# Patient Record
Sex: Male | Born: 1965 | Race: White | Hispanic: No | Marital: Married | Smoking: Former smoker
Health system: Southern US, Community
[De-identification: ages and names within clinical notes are randomized; demographics above are authoritative.]

## PROBLEM LIST (undated history)

## (undated) DIAGNOSIS — Z9289 Personal history of other medical treatment: Secondary | ICD-10-CM

## (undated) DIAGNOSIS — C801 Malignant (primary) neoplasm, unspecified: Secondary | ICD-10-CM

## (undated) DIAGNOSIS — B081 Molluscum contagiosum: Secondary | ICD-10-CM

## (undated) DIAGNOSIS — K602 Anal fissure, unspecified: Secondary | ICD-10-CM

## (undated) DIAGNOSIS — N486 Induration penis plastica: Secondary | ICD-10-CM

## (undated) DIAGNOSIS — Q676 Pectus excavatum: Secondary | ICD-10-CM

## (undated) DIAGNOSIS — Z85828 Personal history of other malignant neoplasm of skin: Secondary | ICD-10-CM

## (undated) DIAGNOSIS — Z9889 Other specified postprocedural states: Secondary | ICD-10-CM

## (undated) HISTORY — DX: Pectus excavatum: Q67.6

## (undated) HISTORY — PX: EYE SURGERY: SHX253

## (undated) HISTORY — DX: Molluscum contagiosum: B08.1

## (undated) HISTORY — DX: Personal history of other medical treatment: Z92.89

## (undated) HISTORY — DX: Personal history of other malignant neoplasm of skin: Z85.828

## (undated) HISTORY — DX: Anal fissure, unspecified: K60.2

## (undated) HISTORY — DX: Other specified postprocedural states: Z98.890

## (undated) HISTORY — PX: VASECTOMY: SHX75

---

## 2004-09-22 ENCOUNTER — Encounter: Admission: RE | Admit: 2004-09-22 | Discharge: 2004-09-22 | Payer: Self-pay | Admitting: Emergency Medicine

## 2009-05-21 HISTORY — PX: MELANOMA EXCISION: SHX5266

## 2010-02-27 ENCOUNTER — Ambulatory Visit: Payer: Self-pay | Admitting: Family Medicine

## 2010-02-27 DIAGNOSIS — K602 Anal fissure, unspecified: Secondary | ICD-10-CM

## 2010-02-27 DIAGNOSIS — Z85828 Personal history of other malignant neoplasm of skin: Secondary | ICD-10-CM

## 2010-02-27 HISTORY — DX: Anal fissure, unspecified: K60.2

## 2010-03-21 ENCOUNTER — Ambulatory Visit: Payer: Self-pay | Admitting: Family Medicine

## 2010-03-21 ENCOUNTER — Telehealth: Payer: Self-pay | Admitting: Family Medicine

## 2010-03-22 ENCOUNTER — Telehealth: Payer: Self-pay | Admitting: Family Medicine

## 2010-06-20 NOTE — Progress Notes (Signed)
Summary: Resend Rx  Phone Note Call from Patient Call back at Work Phone [phone]   Summary of Call: Patient called and said his rx was on back order, [facility] [place] [address] [facility]. Please call him and let it know where it was sent.  Initial call taken by: Harold Barban,  March 21, 2010 1:53 PM  Follow-up for Phone Call        Phone Call Completed, Rx Called In Follow-up by: Kern Reap CMA Duncan Dull),  March 21, 2010 2:54 PM

## 2010-06-20 NOTE — Progress Notes (Signed)
Summary: biaxin question  Phone Note Call from Patient Call back at [phone]   Summary of Call: because the biaxin  was changed to 1 pill instead of 2 pills for 10 days would you like for the patinet to take one tab for 10 days or longer?   Initial call taken by: Kern Reap CMA Duncan Dull),  March 22, 2010 11:42 AM  Follow-up for Phone Call        one tablet a day for 10 days Follow-up by: Roderick Pee MD,  March 22, 2010 11:48 AM  Additional Follow-up for Phone Call Additional follow up Details #1::        Phone Call Completed Additional Follow-up by: Kern Reap CMA Duncan Dull),  March 22, 2010 12:02 PM

## 2010-06-20 NOTE — Assessment & Plan Note (Signed)
Summary: URI? // RS   Vital Signs:  Patient profile:   45 year old male Height:      74 inches Weight:      200 pounds BMI:     25.77 Temp:     98.7 degrees F BP sitting:   130 / 90  (left arm) Cuff size:   regular  Vitals Entered By: Kern Reap CMA Duncan Dull) (March 21, 2010 12:35 PM) CC: chest/head congestion, right chest pain   CC:  chest/head congestion and right chest pain.  History of Present Illness: Juan Bender is a 45 year old, married male, nonsmoker, who comes in today with a persistent cough.  We saw him about a month ago with a cough and some wheezing.  Chest x-ray was normal.  We placed him on a short course of prednisone.  That didn't seem to help all that much.  He now comes in stating the cough is unchanged.  He has no fever no sputum production.  He does have a lot of head congestion and postnasal drip  Allergies (verified): No Known Drug Allergies  Past History:  Past medical, surgical, family and social histories (including risk factors) reviewed for relevance to current acute and chronic problems.  Past Medical History: Reviewed history from 02/27/2010 and no changes required. Skin cancer, hx of...melanomas x 3 2011....[facility].  Past Surgical History: Reviewed history from 02/27/2010 and no changes required. melanoma x 3........ two on the back, one on the left side of his face treated [facility]ology  Family History: Reviewed history from 02/27/2010 and no changes required. father 47, has beginnings of memory problems mother alive and well no brothers one sister in good health  Social History: Reviewed history from 02/27/2010 and no changes required. Occupation: Married Never Smoked Alcohol use-no Drug use-no Regular exercise-yes  Review of Systems      See HPI  Physical Exam  General:  Well-developed,well-nourished,in no acute distress; alert,appropriate and cooperative throughout examination Head:  Normocephalic and atraumatic without  obvious abnormalities. No apparent alopecia or balding. Eyes:  No corneal or conjunctival inflammation noted. EOMI. Perrla. Funduscopic exam benign, without hemorrhages, exudates or papilledema. Vision grossly normal. Ears:  External ear exam shows no significant lesions or deformities.  Otoscopic examination reveals clear canals, tympanic membranes are intact bilaterally without bulging, retraction, inflammation or discharge. Hearing is grossly normal bilaterally. Nose:  External nasal examination shows no deformity or inflammation. Nasal mucosa are pink and moist without lesions or exudates. Mouth:  Oral mucosa and oropharynx without lesions or exudates.  Teeth in good repair. Neck:  No deformities, masses, or tenderness noted. Lungs:  Normal respiratory effort, chest expands symmetrically. Lungs are clear to auscultation, no crackles or wheezes.   Problems:  Medical Problems Added: 1)  Dx of Cough  (ICD-786.2)  Impression & Recommendations:  Problem # 1:  COUGH (ICD-786.2) Assessment Unchanged  Complete Medication List: 1)  Biaxin 500 Mg Tabs (Clarithromycin) .... Take 1 tablet by mouth two times a day  Patient Instructions: 1)  begin Biaxin 500 mg twice daily, drink lots of water, return p.r.n. Prescriptions: BIAXIN 500 MG TABS (CLARITHROMYCIN) Take 1 tablet by mouth two times a day  #20 x 1   Entered and Authorized by:   Roderick Pee MD   Signed by:   Roderick Pee MD on 03/21/2010   Method used:   [facility]  [address]  [address]       [place], [place]  [address]       Ph: [phone] or [phone]       Fax: [phone]   RxID:   [id number]    Orders Added: 1)  Est. Patient Level III [42706]

## 2010-06-20 NOTE — Assessment & Plan Note (Signed)
Summary: URI/BLOOD IN STOOL/ACU VISIT ONLY/C[facility]   Vital Signs:  Patient profile:   45 year old male Weight:      194 pounds Temp:     99 degrees F oral Resp:     16 per minute BP sitting:   146 / 96  Vitals Entered By: Lynann Beaver CMA (February 27, 2010 9:09 AM) CC: URI/cough Is Patient Diabetic? No Pain Assessment Patient in pain? no        CC:  URI/cough.  History of Present Illness: Juan Bender is a 45 year old, married male, nonsmoker, who comes in today as a new patient for evaluation of two problems.  He states on September the 21st he developed fever and cough.  He went to his [facility] physician on September the 28th.  He was told he had a viral infection however, was given a Z-Pak.  He took it and it did seem to make any difference.  He still has a cough.  No fever.  He's also had some right-sided chest pain.  The day prior to this episode.  He took a 5 hour airplane ride to [facility].  Pulmonary review of systems negative.  No hemoptysis.he also complains of some sharp, right-sided chest pain  A second problem is by rectal bleeding that he said since October.  The second.  It's painful.  No history of any GI problems.  Father has a history of colon polyps.  This past year.  He said 3 melanomas removed, one from his left side of his face two from his back by [facility]  Current Medications (verified): 1)  None  Allergies (verified): No Known Drug Allergies  Review of Systems      See HPI  Physical Exam  General:  Well-developed,well-nourished,in no acute distress; alert,appropriate and cooperative throughout examination Head:  Normocephalic and atraumatic without obvious abnormalities. No apparent alopecia or balding. Eyes:  No corneal or conjunctival inflammation noted. EOMI. Perrla. Funduscopic exam benign, without hemorrhages, exudates or papilledema. Vision grossly normal. Ears:  External ear exam shows no significant lesions or deformities.  Otoscopic  examination reveals clear canals, tympanic membranes are intact bilaterally without bulging, retraction, inflammation or discharge. Hearing is grossly normal bilaterally. Nose:  External nasal examination shows no deformity or inflammation. Nasal mucosa are pink and moist without lesions or exudates. Mouth:  Oral mucosa and oropharynx without lesions or exudates.  Teeth in good repair. Neck:  No deformities, masses, or tenderness noted. Chest Wall:  pectus deformity Breasts:  No masses or gynecomastia noted Lungs:  Normal respiratory effort, chest expands symmetrically. Lungs are clear to auscultation, no crackles or wheezes. Heart:  Normal rate and regular rhythm. S1 and S2 normal without gallop, murmur, click, rub or other extra sounds. Abdomen:  Bowel sounds positive,abdomen soft and non-tender without masses, organomegaly or hernias noted. Rectal:  no obvious external hemorrhoids.  There is a rectal fissure at the 6 o'clock position.  Digital rectal exam negative.  Stool guaiac-negative   Problems:  Medical Problems Added: 1)  Dx of Rectal Fissure  (ICD-565.0) 2)  Dx of Cough  (ICD-786.2)  Impression & Recommendations:  Problem # 1:  COUGH (ICD-786.2) Assessment New  Orders: T-2 View CXR (71020TC)  Problem # 2:  RECTAL FISSURE (ICD-565.0) Assessment: New  Complete Medication List: 1)  Prednisone 20 Mg Tabs (Prednisone) .... Uad  Patient Instructions: 1)  go to the main office now for a chest x-ray. 2)  When we get the x-ray report back.  I will call you . 3)  Begin prednisone two tabs x 3 days, one x 3 days, a half x 3 days, then a half a tablet Monday, Wednesday, Friday, for a two week taper. 4)  Take milk of Magnesia or the stool softener of your preference and soak for 10 minutes atbedtime   Apply medicated cream.  This should resolve in 10 days to two weeks.  If the bleeding does not resolve call me.  Prescriptions: PREDNISONE 20 MG TABS (PREDNISONE) UAD  #30 x 1    Entered and Authorized by:   Roderick Pee MD   Signed by:   Roderick Pee MD on 02/27/2010   Method used:   Print then Give to Patient   RxID:   [id number]

## 2011-12-04 ENCOUNTER — Other Ambulatory Visit: Payer: Self-pay | Admitting: Urology

## 2011-12-18 ENCOUNTER — Encounter (HOSPITAL_BASED_OUTPATIENT_CLINIC_OR_DEPARTMENT_OTHER): Payer: Self-pay | Admitting: *Deleted

## 2011-12-18 NOTE — Progress Notes (Signed)
Pt instructed npo p mn 8/1.  To [facility] 8/2 @ 0730.  Needs hgb, ekg on arrival

## 2011-12-20 NOTE — H&P (Signed)
History of Present Illness          46 yo married male with a hx of Peyronie's disease.  Hx of post traumatic sexual activity 4 yrs ago. Pt felt a "pop" with intercourse, and now has penile curvature dorsal, 20 degrees. It is non-painful, and he is still able to penetrate with intercourse. He has significant "pulling" sensation with erection. He voids normally.  Patient was offerred to participate in the [employer] drug study, but patient declined.   Past Medical History Problems  1. History of  Destruction Of Malignant Lesion 2. History of  Skin Cancer V10.83  Surgical History Problems  1. History of  Corneal LASIK Bilateral 2. History of  Surgery Of Male Genitalia Vasectomy V25.2  Current Meds 1. No Reported Medications  Allergies Medication  1. No Known Drug Allergies  Family History Problems  1. Family history of  Family Health Status - Father's Age 60. Family history of  Family Health Status - Mother's Age 44. Family history of  Family Health Status Number Of Children 1 son, 2 daughters  Social History Problems  1. Alcohol Use 5-7 per wk 2. Caffeine Use 4 per day 3. Former Smoker V15.82 1 cigar per wk x 3-40yrs, quit 5+yrs ago 4. Marital History - Currently Married 5. Occupation: Management  Review of Systems Genitourinary, constitutional, skin, eye, otolaryngeal, hematologic/lymphatic, cardiovascular, pulmonary, endocrine, musculoskeletal, gastrointestinal, neurological and psychiatric system(s) were reviewed and pertinent findings if present are noted.  Genitourinary: erectile dysfunction, penile pain and penile curvature.    Vitals Vital Signs Blood Pressure: 106 / 59 Heart Rate: 55  Physical Exam Constitutional: Well nourished and well developed . No acute distress.  Abdomen: The abdomen is soft and nontender. No masses are palpated. No CVA tenderness. No hernias are palpable. No hepatosplenomegaly noted.  Genitourinary: Examination of the penis  demonstrates no swelling, no tenderness, no discharge, no masses, no adherence of the prepuce, no phimosis, no paraphimosis, no lesions and a normal meatus. The penis is circumcised. The scrotum is without lesions. The right epididymis is palpably normal and non-tender. The left epididymis is palpably normal and non-tender. The right testis is non-tender and without masses. The left testis is non-tender and without masses. Peyronie's plaque, bilateral distal, dorsal corpora cavernosa. Circumcised. Non-tender.  Lymphatics: The femoral and inguinal nodes are not enlarged or tender.  Skin: Normal skin turgor, no visible rash and no visible skin lesions.  Neuro/Psych:. Mood and affect are appropriate.    Assessment Assessed  1. Peyronie's Disease 607.85   Peyronie's disease, with functional loss of 1cm length of penis. He is able to get erection, although with 30 degrees of dorsal curveature, and erectile ability in both the proximal corpora and distal corproa and glans. He is still able to gain penetration.    Surgery would be outpatient and would take 6 weeks to heal. 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 He may need more surgery if the 16 dot plication doesn't work, including slits in the scar, and intra op molding. He will not be able to have sex for 6 weeks.   Plan: We discussed his history and since he still is able to have good erections but is having difficulty engaging in intercourse. I therefore have discussed with the patient the procedure of 16 dot plication in detail. We discussed the alternatives. I went over the incision used, the risks and comp occasions as well as anticipated  postoperative course. He is aware of the foreshortening that can occur with this procedure and has elected to proceed.

## 2011-12-21 ENCOUNTER — Encounter (HOSPITAL_BASED_OUTPATIENT_CLINIC_OR_DEPARTMENT_OTHER): Payer: Self-pay | Admitting: *Deleted

## 2011-12-21 ENCOUNTER — Ambulatory Visit (HOSPITAL_BASED_OUTPATIENT_CLINIC_OR_DEPARTMENT_OTHER)
Admission: RE | Admit: 2011-12-21 | Discharge: 2011-12-21 | Disposition: A | Discharge status: Home / Self Care | Payer: BC Managed Care – PPO | Source: Ambulatory Visit | Attending: Urology | Admitting: Urology

## 2011-12-21 ENCOUNTER — Ambulatory Visit (HOSPITAL_BASED_OUTPATIENT_CLINIC_OR_DEPARTMENT_OTHER): Payer: BC Managed Care – PPO | Admitting: Anesthesiology

## 2011-12-21 ENCOUNTER — Encounter (HOSPITAL_BASED_OUTPATIENT_CLINIC_OR_DEPARTMENT_OTHER): Payer: Self-pay | Admitting: Anesthesiology

## 2011-12-21 ENCOUNTER — Encounter (HOSPITAL_BASED_OUTPATIENT_CLINIC_OR_DEPARTMENT_OTHER)
Admission: RE | Disposition: A | Discharge status: Home / Self Care | Payer: Self-pay | Source: Ambulatory Visit | Attending: Urology

## 2011-12-21 DIAGNOSIS — Z01810 Encounter for preprocedural cardiovascular examination: Secondary | ICD-10-CM | POA: Insufficient documentation

## 2011-12-21 DIAGNOSIS — N486 Induration penis plastica: Secondary | ICD-10-CM | POA: Diagnosis present

## 2011-12-21 DIAGNOSIS — Z85828 Personal history of other malignant neoplasm of skin: Secondary | ICD-10-CM | POA: Insufficient documentation

## 2011-12-21 DIAGNOSIS — Q5569 Other congenital malformation of penis: Secondary | ICD-10-CM | POA: Insufficient documentation

## 2011-12-21 HISTORY — DX: Malignant (primary) neoplasm, unspecified: C80.1

## 2011-12-21 HISTORY — PX: NESBIT PROCEDURE: SHX2087

## 2011-12-21 HISTORY — DX: Induration penis plastica: N48.6

## 2011-12-21 SURGERY — NESBIT PROCEDURE
Anesthesia: General | Site: Penis | Wound class: Clean

## 2011-12-21 MED ORDER — CEFAZOLIN SODIUM-DEXTROSE 2-3 GM-% IV SOLR: 2.0000 g | INTRAVENOUS | Status: DC

## 2011-12-21 MED ORDER — CEFAZOLIN SODIUM 1-5 GM-% IV SOLN: 1.0000 g | INTRAVENOUS | Status: DC

## 2011-12-21 MED ORDER — LIDOCAINE HCL (CARDIAC) 20 MG/ML IV SOLN
INTRAVENOUS | Status: DC | PRN
  Administered 2011-12-21: 100 mg via INTRAVENOUS

## 2011-12-21 MED ORDER — MIDAZOLAM HCL 5 MG/5ML IJ SOLN
INTRAMUSCULAR | Status: DC | PRN
  Administered 2011-12-21: 2 mg via INTRAVENOUS

## 2011-12-21 MED ORDER — KETOROLAC TROMETHAMINE 30 MG/ML IJ SOLN
INTRAMUSCULAR | Status: DC | PRN
  Administered 2011-12-21: 30 mg via INTRAVENOUS

## 2011-12-21 MED ORDER — HYDROCODONE-ACETAMINOPHEN 10-325 MG PO TABS: 1.0000 | ORAL_TABLET | ORAL | Status: AC | PRN

## 2011-12-21 MED ORDER — CEFAZOLIN SODIUM 1-5 GM-% IV SOLN
INTRAVENOUS | Status: DC | PRN
  Administered 2011-12-21: 2 g via INTRAVENOUS

## 2011-12-21 MED ORDER — LACTATED RINGERS IV SOLN
INTRAVENOUS | Status: DC
  Administered 2011-12-21: 08:00:00 via INTRAVENOUS

## 2011-12-21 MED ORDER — PROPOFOL 10 MG/ML IV EMUL
INTRAVENOUS | Status: DC | PRN
  Administered 2011-12-21: 300 mg via INTRAVENOUS

## 2011-12-21 MED ORDER — MEPERIDINE HCL 25 MG/ML IJ SOLN: 6.2500 mg | INTRAMUSCULAR | Status: DC | PRN

## 2011-12-21 MED ORDER — DEXAMETHASONE SODIUM PHOSPHATE 4 MG/ML IJ SOLN
INTRAMUSCULAR | Status: DC | PRN
  Administered 2011-12-21: 10 mg via INTRAVENOUS

## 2011-12-21 MED ORDER — PAPAVERINE HCL 30 MG/ML IJ SOLN
INTRAMUSCULAR | Status: DC | PRN
  Administered 2011-12-21: 180 mg via INTRAVENOUS

## 2011-12-21 MED ORDER — LACTATED RINGERS IV SOLN: INTRAVENOUS | Status: DC

## 2011-12-21 MED ORDER — PROMETHAZINE HCL 25 MG/ML IJ SOLN: 6.2500 mg | INTRAMUSCULAR | Status: DC | PRN

## 2011-12-21 MED ORDER — FENTANYL CITRATE 0.05 MG/ML IJ SOLN: 25.0000 ug | INTRAMUSCULAR | Status: DC | PRN

## 2011-12-21 MED ORDER — FENTANYL CITRATE 0.05 MG/ML IJ SOLN
INTRAMUSCULAR | Status: DC | PRN
  Administered 2011-12-21 (×6): 25 ug via INTRAVENOUS
  Administered 2011-12-21: 100 ug via INTRAVENOUS
  Administered 2011-12-21 (×2): 25 ug via INTRAVENOUS

## 2011-12-21 MED ORDER — ONDANSETRON HCL 4 MG/2ML IJ SOLN
INTRAMUSCULAR | Status: DC | PRN
  Administered 2011-12-21: 4 mg via INTRAVENOUS

## 2011-12-21 MED ORDER — PHENYLEPHRINE HCL 10 MG/ML IJ SOLN
INTRAMUSCULAR | Status: DC | PRN
  Administered 2011-12-21: 100 ug

## 2011-12-21 MED ORDER — LACTATED RINGERS IV SOLN
INTRAVENOUS | Status: DC | PRN
  Administered 2011-12-21: 08:00:00 via INTRAVENOUS

## 2011-12-21 SURGICAL SUPPLY — 68 items
BAND RUBBER #16 2-1/2X1/16 STR (MISCELLANEOUS) IMPLANT
BANDAGE CO FLEX L/F 1IN X 5YD (GAUZE/BANDAGES/DRESSINGS) IMPLANT
BANDAGE CO FLEX L/F 2IN X 5YD (GAUZE/BANDAGES/DRESSINGS) IMPLANT
BANDAGE COBAN STERILE 2 (GAUZE/BANDAGES/DRESSINGS) ×1 IMPLANT
BANDAGE GAUZE ELAST BULKY 4 IN (GAUZE/BANDAGES/DRESSINGS) ×2 IMPLANT
BLADE SURG 15 STRL LF DISP TIS (BLADE) ×1 IMPLANT
BLADE SURG 15 STRL SS (BLADE) ×2
BLADE SURG ROTATE 9660 (MISCELLANEOUS) IMPLANT
BNDG COHESIVE 3X5 TAN STRL LF (GAUZE/BANDAGES/DRESSINGS) ×2 IMPLANT
CANISTER SUCTION 1200CC (MISCELLANEOUS) IMPLANT
CANISTER SUCTION 2500CC (MISCELLANEOUS) ×1 IMPLANT
CATH FOLEY 2WAY SLVR  5CC 16FR (CATHETERS) ×1
CATH FOLEY 2WAY SLVR 5CC 16FR (CATHETERS) ×1 IMPLANT
CATH ROBINSON RED A/P 8FR (CATHETERS) ×2 IMPLANT
CLOTH BEACON ORANGE TIMEOUT ST (SAFETY) ×2 IMPLANT
COVER MAYO STAND STRL (DRAPES) IMPLANT
COVER TABLE BACK 60X90 (DRAPES) IMPLANT
DRAIN PENROSE 18X1/4 LTX STRL (WOUND CARE) IMPLANT
DRAPE PED LAPAROTOMY (DRAPES) ×2 IMPLANT
ELECT NDL TIP 2.8 STRL (NEEDLE) IMPLANT
ELECT NEEDLE TIP 2.8 STRL (NEEDLE) IMPLANT
ELECT REM PT RETURN 9FT ADLT (ELECTROSURGICAL) ×2
ELECTRODE REM PT RTRN 9FT ADLT (ELECTROSURGICAL) ×1 IMPLANT
GAUZE SPONGE 4X4 12PLY STRL LF (GAUZE/BANDAGES/DRESSINGS) IMPLANT
GLOVE BIO SURGEON STRL SZ 6.5 (GLOVE) ×1 IMPLANT
GLOVE BIO SURGEON STRL SZ8 (GLOVE) ×2 IMPLANT
GLOVE ECLIPSE 6.0 STRL STRAW (GLOVE) ×1 IMPLANT
GOWN STRL NON-REIN LRG LVL3 (GOWN DISPOSABLE) ×1 IMPLANT
GOWN STRL REIN XL XLG (GOWN DISPOSABLE) ×1 IMPLANT
GOWN W/COTTON TOWEL STD LRG (GOWNS) ×1 IMPLANT
GOWN XL W/COTTON TOWEL STD (GOWNS) ×2 IMPLANT
IV NS IRRIG 3000ML ARTHROMATIC (IV SOLUTION) IMPLANT
IV TUR Y TUBING LATEX FREE (IV SOLUTION) IMPLANT
LOOP VESSEL MAXI BLUE (MISCELLANEOUS) IMPLANT
NDL HYPO 25X1 1.5 SAFETY (NEEDLE) ×3 IMPLANT
NDL HYPO 30X.5 LL (NEEDLE) IMPLANT
NEEDLE HYPO 25X1 1.5 SAFETY (NEEDLE) ×6 IMPLANT
NEEDLE HYPO 30X.5 LL (NEEDLE) ×4 IMPLANT
NS IRRIG 500ML POUR BTL (IV SOLUTION) ×2 IMPLANT
PACK BASIN DAY SURGERY FS (CUSTOM PROCEDURE TRAY) ×2 IMPLANT
PENCIL BUTTON HOLSTER BLD 10FT (ELECTRODE) ×2 IMPLANT
PLUG CATH AND CAP STER (CATHETERS) ×2 IMPLANT
SET COLLECT BLD 21X3/4 12 (NEEDLE) ×2 IMPLANT
SPONGE GAUZE 4X4 12PLY (GAUZE/BANDAGES/DRESSINGS) ×1 IMPLANT
SPONGE LAP 4X18 X RAY DECT (DISPOSABLE) ×1 IMPLANT
SUCTION FRAZIER TIP 10 FR DISP (SUCTIONS) IMPLANT
SUPPORT SCROTAL LG STRP (MISCELLANEOUS) IMPLANT
SUT CHROMIC 3 0 SH 27 (SUTURE) IMPLANT
SUT CHROMIC 4 0 RB 1X27 (SUTURE) ×3 IMPLANT
SUT CHROMIC 5 0 RB 1 27 (SUTURE) IMPLANT
SUT ETHIBOND 2 0 SH (SUTURE) ×2
SUT ETHIBOND 2 0 SH 36X2 (SUTURE) ×3 IMPLANT
SUT ETHIBOND 2 OS 4 DA (SUTURE) IMPLANT
SUT ETHIBOND 3-0 V-5 (SUTURE) IMPLANT
SUT PDS AB 4-0 RB1 27 (SUTURE) IMPLANT
SUT SILK 4 0 (SUTURE)
SUT SILK 4-0 18XBRD TIE 12 (SUTURE) IMPLANT
SUT VIC AB 5-0 RB1 27 (SUTURE) IMPLANT
SYR 3ML 18GX1 1/2 (SYRINGE) ×1 IMPLANT
SYR 5ML LL (SYRINGE) ×4 IMPLANT
SYR BULB IRRIGATION 50ML (SYRINGE) IMPLANT
SYR CONTROL 10ML LL (SYRINGE) ×2 IMPLANT
SYRINGE 10CC LL (SYRINGE) ×4 IMPLANT
TOWEL OR 17X24 6PK STRL BLUE (TOWEL DISPOSABLE) ×4 IMPLANT
TUBE CONNECTING 12X1/4 (SUCTIONS) IMPLANT
WATER STERILE IRR 3000ML UROMA (IV SOLUTION) IMPLANT
WATER STERILE IRR 500ML POUR (IV SOLUTION) ×2 IMPLANT
YANKAUER SUCT BULB TIP NO VENT (SUCTIONS) IMPLANT

## 2011-12-21 NOTE — Transfer of Care (Signed)
Immediate Anesthesia Transfer of Care Note  Patient: Juan Bender  Procedure(s) Performed: Procedure(s) (LRB): NESBIT PROCEDURE (N/A)  Patient Location: PACU  Anesthesia Type: General  Level of Consciousness: awake, sedated, patient cooperative and responds to stimulation  Airway & Oxygen Therapy: Patient Spontanous Breathing and Patient connected to face mask oxygen  Post-op Assessment: Report given to PACU RN, Post -op Vital signs reviewed and stable and Patient moving all extremities  Post vital signs: Reviewed and stable  Complications: No apparent anesthesia complications

## 2011-12-21 NOTE — Op Note (Signed)
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 This occurred after penile trauma during intercourse for years ago. He now is able to achieve an erection but has a significant pulling sensation with erection and difficulty with intercourse. He has elected to proceed with the surgery as noted above.  ANESTHESIA:  General  EBL:  Minimal  Description of procedure: After informed consent the patient was taken to the major or, placed on the table and administered general anesthesia in the supine position. His genitalia was sterilely prepped and draped and an official timeout was then performed.  I initially injected 30 mg of papaverine into the corpus cavernosum using a 30-gauge needle. This resulted in a good erection and I was able to determine the point of maximum curvature. This was marked both dorsally and ventrally with a surgical marker. A Foley catheter was then inserted in the bladder and the bladder was drained.  An incision was made in the midline over the corpus spongiosum and and I then dissected down to the corpus spongiosum throughout the length of the incision. I then dissected laterally on to the corpus cavernosum on each side. Retractors were used to retract the skin distally and proximally to expose the corpus cavernosum throughout its length on each side of the area of greatest curvature. I injected a second 60 cc of papaverine and then marked, with a surgical marker, the 16 equally placed knots approximately 3 mm lateral to the corpus spongiosum. This traversed the area of greatest curvature equally both proximally and distally. I then used 2-0 braided polyester suture and placed this in the previously located  marks. This was placed through the first set of marks on the left side distally and then the left side proximally. The same thing was performed on the right side. This allowed the 4 separate sutures to be tied with a surgical knot and a hemostat applied to the not so that I could then make adjustments both tightening and loosening each of the sutures until I achieved a straight penis without any curvature in the dorsal/ventral and lateral planes. I then tied each of these sutures down.   I then injected 500 mcg of Neo-Synephrine which resulted in detumescence of the penis. Bleeding points were cauterized and I then closed the incision first by closing the deep penile tissue in the midline with a running, locking 4-0 chromic suture. A second 4-0 chromic was used to close the penile skin in the midline in a running fashion as well. I then applied Neosporin to the incision, placed a single sterile 4 x 4 gauze over the incision and then gently wrapped the penis with 4 x 4 gauze that had been unfolded. Multiple pieces of gauze were used in order to allow some expansion if necessary but also apply some mild pressure and a Kopan was used to secure this to the penis. The patient was awakened and taken to the recovery room in stable and satisfactory condition. He tolerated the procedure well with no intraoperative complications.  PLAN OF CARE: Discharge to home after PACU  PATIENT DISPOSITION:  PACU - hemodynamically stable.

## 2011-12-21 NOTE — OR Nursing (Signed)
neosynephrine 165mcg/ ml used to decrease erection during procedure.

## 2011-12-21 NOTE — Anesthesia Preprocedure Evaluation (Signed)
Anesthesia Evaluation  Patient identified by MRN, date of birth, ID band Patient awake    Reviewed: Allergy & Precautions, H&P , NPO status , Patient's Chart, lab work & pertinent test results  Airway Mallampati: II TM Distance: >3 FB Neck ROM: full    Dental No notable dental hx.    Pulmonary neg pulmonary ROS,  breath sounds clear to auscultation  Pulmonary exam normal       Cardiovascular Exercise Tolerance: Good negative cardio ROS  Rhythm:regular Rate:Normal     Neuro/Psych negative neurological ROS  negative psych ROS   GI/Hepatic negative GI ROS, Neg liver ROS,   Endo/Other  negative endocrine ROS  Renal/GU negative Renal ROS  negative genitourinary   Musculoskeletal   Abdominal   Peds  Hematology negative hematology ROS (+)   Anesthesia Other Findings   Reproductive/Obstetrics negative OB ROS                           Anesthesia Physical Anesthesia Plan  ASA: I  Anesthesia Plan: General LMA   Post-op Pain Management:    Induction:   Airway Management Planned:   Additional Equipment:   Intra-op Plan:   Post-operative Plan:   Informed Consent: I have reviewed the patients History and Physical, chart, labs and discussed the procedure including the risks, benefits and alternatives for the proposed anesthesia with the patient or authorized representative who has indicated his/her understanding and acceptance.   Dental Advisory Given  Plan Discussed with: CRNA  Anesthesia Plan Comments:         Anesthesia Quick Evaluation  

## 2011-12-21 NOTE — Anesthesia Procedure Notes (Addendum)
Procedure Name: LMA Insertion Date/Time: 12/21/2011 9:03 AM Performed by: Jessica Priest Pre-anesthesia Checklist: Patient identified, Emergency Drugs available, Suction available and Patient being monitored Patient Re-evaluated:Patient Re-evaluated prior to inductionOxygen Delivery Method: Circle System Utilized Preoxygenation: Pre-oxygenation with 100% oxygen Intubation Type: IV induction Ventilation: Mask ventilation without difficulty LMA: LMA inserted LMA Size: 5.0 Number of attempts: 1 Airway Equipment and Method: bite block Placement Confirmation: positive ETCO2 Tube secured with: Tape Dental Injury: Teeth and Oropharynx as per pre-operative assessment

## 2011-12-21 NOTE — Anesthesia Postprocedure Evaluation (Signed)
  Anesthesia Post-op Note  Patient: Juan Bender  Procedure(s) Performed: Procedure(s) (LRB): NESBIT PROCEDURE (N/A)  Patient Location: PACU  Anesthesia Type: General  Level of Consciousness: awake and alert   Airway and Oxygen Therapy: Patient Spontanous Breathing  Post-op Pain: mild  Post-op Assessment: Post-op Vital signs reviewed, Patient's Cardiovascular Status Stable, Respiratory Function Stable, Patent Airway and No signs of Nausea or vomiting  Post-op Vital Signs: stable  Complications: No apparent anesthesia complications

## 2011-12-21 NOTE — Interval H&P Note (Signed)
History and Physical Interval Note:  12/21/2011 8:19 AM  Juan Bender  has presented today for surgery, with the diagnosis of Peyronie's Disease  The various methods of treatment have been discussed with the patient and family. After consideration of risks, benefits and other options for treatment, the patient has consented to  Procedure(s) (LRB): NESBIT PROCEDURE (N/A) as a surgical intervention .  The patient's history has been reviewed, patient examined, no change in status, stable for surgery.  I have reviewed the patient's chart and labs.  Questions were answered to the patient's satisfaction.     Garnett Farm

## 2011-12-24 ENCOUNTER — Encounter (HOSPITAL_BASED_OUTPATIENT_CLINIC_OR_DEPARTMENT_OTHER): Payer: Self-pay | Admitting: Urology

## 2011-12-24 LAB — POCT HEMOGLOBIN-HEMACUE: Hemoglobin: 15.7 g/dL (ref 13.0–17.0)

## 2012-07-17 DIAGNOSIS — Z86006 Personal history of melanoma in-situ: Secondary | ICD-10-CM | POA: Insufficient documentation

## 2013-05-29 ENCOUNTER — Encounter: Payer: BC Managed Care – PPO | Admitting: Family Medicine

## 2013-05-29 DIAGNOSIS — Z0289 Encounter for other administrative examinations: Secondary | ICD-10-CM

## 2013-05-29 NOTE — Progress Notes (Signed)
Error   This encounter was created in error - please disregard. 

## 2013-09-10 ENCOUNTER — Ambulatory Visit (INDEPENDENT_AMBULATORY_CARE_PROVIDER_SITE_OTHER)
Admission: RE | Admit: 2013-09-10 | Discharge: 2013-09-10 | Disposition: A | Discharge status: Home / Self Care | Payer: BC Managed Care – PPO | Source: Ambulatory Visit | Attending: Family Medicine | Admitting: Family Medicine

## 2013-09-10 ENCOUNTER — Ambulatory Visit (INDEPENDENT_AMBULATORY_CARE_PROVIDER_SITE_OTHER): Payer: BC Managed Care – PPO | Admitting: Family Medicine

## 2013-09-10 ENCOUNTER — Encounter: Payer: Self-pay | Admitting: Family Medicine

## 2013-09-10 VITALS — BP 120/80 | Temp 97.8°F | Wt 204.0 lb

## 2013-09-10 DIAGNOSIS — Z85828 Personal history of other malignant neoplasm of skin: Secondary | ICD-10-CM

## 2013-09-10 DIAGNOSIS — L03032 Cellulitis of left toe: Secondary | ICD-10-CM

## 2013-09-10 DIAGNOSIS — R05 Cough: Secondary | ICD-10-CM

## 2013-09-10 DIAGNOSIS — G4730 Sleep apnea, unspecified: Secondary | ICD-10-CM

## 2013-09-10 DIAGNOSIS — R059 Cough, unspecified: Secondary | ICD-10-CM

## 2013-09-10 DIAGNOSIS — L03039 Cellulitis of unspecified toe: Secondary | ICD-10-CM

## 2013-09-10 DIAGNOSIS — R0683 Snoring: Secondary | ICD-10-CM | POA: Insufficient documentation

## 2013-09-10 MED ORDER — CEPHALEXIN 500 MG PO CAPS: ORAL_CAPSULE | ORAL | Status: DC

## 2013-09-10 NOTE — Patient Instructions (Signed)
For the infection in your toe soak nightly, antibiotic ointment and a Band-Aid, Keflex..... 2 tabs twice a day  Go to the main office now for a chest x-ray  If you decide you would like to pursue the sleep apnea evaluation,,,,,,,,,,,dr keith clance  Have your wife so a tennis ball in the T-shirt

## 2013-09-10 NOTE — Progress Notes (Signed)
Pre visit review using our clinic review tool, if applicable. No additional management support is needed unless otherwise documented below in the visit note. 

## 2013-09-10 NOTE — Progress Notes (Signed)
   Subjective:    Patient ID: Juan Bender, male    DOB: [birth year], 48 y.o.   MRN: [id number]  HPI Juan Bender is a 48 year old married male nonsmoker who comes in today for evaluation of a couple problems  He's noticed for the past 2 weeks she's had some pain and swelling l medial portion left great toe. No history of trauma.  He's had sleep apnea for a long time and has not been bothering until about 6 months ago when he started developed a cough. His wife wants him to go for a sleep apnea evaluation however he says even if you went for evaluation he would not wear the device.  2011 he had 2 melanomas excised from his back  No hemoptysis  weight loss etc.   Review of Systems Review of systems otherwise negative    Objective:   Physical Exam Well-developed well-nourished male no acute distress vital signs stable he is afebrile HEENT negative neck was supple no adenopathy lungs are clear to 2 inch scars on his back from previous melanoma excision  Examination of foot shows medial portion left great toe with a slight infection       Assessment & Plan:  Infection left great toenail,,,,,,,,, hot soaks,,,,,,,,,, antibiotics return when necessary  History of sleep apnea......... patient declines further evaluation  Cough,,,,,,,,,,, chest x-ray,,

## 2013-12-30 ENCOUNTER — Encounter: Payer: Self-pay | Admitting: Family Medicine

## 2013-12-30 ENCOUNTER — Ambulatory Visit (INDEPENDENT_AMBULATORY_CARE_PROVIDER_SITE_OTHER): Payer: BC Managed Care – PPO | Admitting: Family Medicine

## 2013-12-30 VITALS — BP 130/90 | Temp 98.0°F | Ht 74.0 in | Wt 202.0 lb

## 2013-12-30 DIAGNOSIS — B081 Molluscum contagiosum: Secondary | ICD-10-CM

## 2013-12-30 HISTORY — DX: Molluscum contagiosum: B08.1

## 2013-12-30 NOTE — Progress Notes (Signed)
Pre visit review using our clinic review tool, if applicable. No additional management support is needed unless otherwise documented below in the visit note. 

## 2013-12-30 NOTE — Patient Instructions (Signed)
The lesions were of all spontaneously if however you would like to see if they would go away quicker I will give you a sterile needle to drain and

## 2013-12-30 NOTE — Progress Notes (Signed)
   Subjective:    Patient ID: Juan Bender, male    DOB: 30-May-1965, 48 y.o.   MRN: [id number]  HPI Juan Bender is a 48 year old male who comes in today for evaluation rash in his groin  He notices a while back and it didn't seem to go away. His nonperiodic   Review of Systems    review of systems negative Objective:   Physical Exam Well-developed well-nourished male no acute distress vital signs stable he is afebrile examination of groin shows a vesicular rash consistent with molluscum       Assessment & Plan:  Molluscum contagiosum is....... treatment is symptomatic

## 2014-05-12 ENCOUNTER — Encounter: Payer: Self-pay | Admitting: Family Medicine

## 2014-05-12 ENCOUNTER — Ambulatory Visit (INDEPENDENT_AMBULATORY_CARE_PROVIDER_SITE_OTHER): Payer: BC Managed Care – PPO | Admitting: Family Medicine

## 2014-05-12 VITALS — BP 102/72 | HR 68 | Temp 98.6°F | Ht 74.0 in | Wt 202.1 lb

## 2014-05-12 DIAGNOSIS — J069 Acute upper respiratory infection, unspecified: Secondary | ICD-10-CM

## 2014-05-12 MED ORDER — HYDROCODONE-HOMATROPINE 5-1.5 MG/5ML PO SYRP: 5.0000 mL | ORAL_SOLUTION | Freq: Three times a day (TID) | ORAL | Status: DC | PRN

## 2014-05-12 NOTE — Progress Notes (Signed)
Pre visit review using our clinic review tool, if applicable. No additional management support is needed unless otherwise documented below in the visit note. 

## 2014-05-12 NOTE — Progress Notes (Signed)
HPI:  URI: -started: 4 days ago -symptoms:nasal congestion, sore throat, cough -denies:fever, SOB, NVD, tooth pain -has tried: sudafed, zycam -sick contacts/travel/risks: denies flu exposure, tick exposure or or Ebola risk -denies hx allergies or lung disease  ROS: See pertinent positives and negatives per HPI.  Past Medical History  Diagnosis Date  . Peyronie's disease   . Cancer     melanoma removed x3    Past Surgical History  Procedure Laterality Date  . Vasectomy    . Eye surgery      lasik  . Melanoma excision  2011    x3  . Nesbit procedure  12/21/2011    Procedure: NESBIT PROCEDURE;  Surgeon: Claybon Jabs, MD;  Location[facility];  Service: Urology;  Laterality: N/A;  with 16 dot plication      No family history on file.  History   Social History  . Marital Status: Married    Spouse Name: N/A    Number of Children: N/A  . Years of Education: N/A   Social History Main Topics  . Smoking status: Former Smoker    Types: Cigars    Quit date: 12/17/2001  . Smokeless tobacco: None  . Alcohol Use: 3.0 oz/week    5 Glasses of wine per week  . Drug Use: No  . Sexual Activity: None   Other Topics Concern  . None   Social History Narrative    Current outpatient prescriptions: HYDROcodone-homatropine (HYCODAN) 5-1.5 MG/5ML syrup, Take 5 mLs by mouth every 8 (eight) hours as needed for cough., Disp: 120 mL, Rfl: 0  EXAM:  Filed Vitals:   05/12/14 0801  BP: 102/72  Pulse: 68  Temp: 98.6 F (37 C)    Body mass index is 25.94 kg/(m^2).  GENERAL: vitals reviewed and listed above, alert, oriented, appears well hydrated and in no acute distress  HEENT: atraumatic, conjunttiva clear, no obvious abnormalities on inspection of external nose and ears, normal appearance of ear canals and TMs, clear nasal congestion, mild post oropharyngeal erythema with PND, no tonsillar edema or exudate, no sinus TTP  NECK: no obvious masses on  inspection  LUNGS: clear to auscultation bilaterally, no wheezes, rales or rhonchi, good air movement  CV: HRRR, no peripheral edema  MS: moves all extremities without noticeable abnormality  PSYCH: pleasant and cooperative, no obvious depression or anxiety  ASSESSMENT AND PLAN:  Discussed the following assessment and plan:  Acute upper respiratory infection - Plan: HYDROcodone-homatropine (HYCODAN) 5-1.5 MG/5ML syrup  -given HPI and exam findings today, a serious infection or illness is unlikely. We discussed potential etiologies, with VURI being most likely, and advised supportive care and monitoring. We discussed treatment side effects, likely course, antibiotic misuse, transmission, and signs of developing a serious illness. -of course, we advised to return or notify a doctor immediately if symptoms worsen or persist or new concerns arise.    Patient Instructions  INSTRUCTIONS FOR UPPER RESPIRATORY INFECTION:  -plenty of rest and fluids  -nasal saline wash 2-3 times daily (use prepackaged nasal saline or bottled/distilled water if making your own)   -can use AFRIN nasal spray for drainage and nasal congestion - but do NOT use longer then 3-4 days  -can use tylenol or ibuprofen as directed for aches and sorethroat  -in the winter time, using a humidifier at night is helpful (please follow cleaning instructions)  -if you are taking a cough medication - use only as directed, may also try a teaspoon of honey  to coat the throat and throat lozenges  -for sore throat, salt water gargles can help  -follow up if you have fevers, facial pain, tooth pain, difficulty breathing or are worsening or not getting better in 5-7 days         Constantinos Krempasky R.

## 2014-05-12 NOTE — Patient Instructions (Signed)
INSTRUCTIONS FOR UPPER RESPIRATORY INFECTION:  -plenty of rest and fluids  -nasal saline wash 2-3 times daily (use prepackaged nasal saline or bottled/distilled water if making your own)   -can use AFRIN nasal spray for drainage and nasal congestion - but do NOT use longer then 3-4 days  -can use tylenol or ibuprofen as directed for aches and sorethroat  -in the winter time, using a humidifier at night is helpful (please follow cleaning instructions)  -if you are taking a cough medication - use only as directed, may also try a teaspoon of honey to coat the throat and throat lozenges  -for sore throat, salt water gargles can help  -follow up if you have fevers, facial pain, tooth pain, difficulty breathing or are worsening or not getting better in 5-7 days  

## 2014-10-12 ENCOUNTER — Ambulatory Visit (INDEPENDENT_AMBULATORY_CARE_PROVIDER_SITE_OTHER): Payer: BLUE CROSS/BLUE SHIELD | Admitting: Adult Health

## 2014-10-12 ENCOUNTER — Encounter: Payer: Self-pay | Admitting: Adult Health

## 2014-10-12 VITALS — BP 98/68 | Temp 98.4°F | Ht 74.0 in | Wt 200.4 lb

## 2014-10-12 DIAGNOSIS — L299 Pruritus, unspecified: Secondary | ICD-10-CM | POA: Diagnosis not present

## 2014-10-12 DIAGNOSIS — T148 Other injury of unspecified body region: Secondary | ICD-10-CM | POA: Diagnosis not present

## 2014-10-12 DIAGNOSIS — W57XXXA Bitten or stung by nonvenomous insect and other nonvenomous arthropods, initial encounter: Secondary | ICD-10-CM | POA: Diagnosis not present

## 2014-10-12 NOTE — Patient Instructions (Addendum)
Apply hydrocortisone or benadryl cream for the itching. Discontinue the use of hydrogen peroxide and alcohol as these may be contributing to the itching. There was no tick noticed under the scab. If you notice a rash on your soles, palms or around the tick bite please let us know. Also follow up with any fever, nausea, or body aches.   Lyme Disease You may have been bitten by a tick and are to watch for the development of Lyme Disease. Lyme Disease is an infection that is caused by a bacteria The bacteria causing this disease is named Borreilia burgdorferi. If a tick is infected with this bacteria and then bites you, then Lyme Disease may occur. These ticks are carried by deer and rodents such as rabbits and mice and infest grassy as well as forested areas. Fortunately most tick bites do not cause Lyme Disease.  Lyme Disease is easier to prevent than to treat. First, covering your legs with clothing when walking in areas where ticks are possibly abundant will prevent their attachment because ticks tend to stay within inches of the ground. Second, using insecticides containing DEET can be applied on skin or clothing. Last, because it takes about 12 to 24 hours for the tick to transmit the disease after attachment to the human host, you should inspect your body for ticks twice a day when you are in areas where Lyme Disease is common. You must look thoroughly when searching for ticks. The Ixodes tick that carries Lyme Disease is very small. It is around the size of a sesame seed (picture of tick is not actual size). Removal is best done by grasping the tick by the head and pulling it out. Do not to squeeze the body of the tick. This could inject the infecting bacteria into the bite site. Wash the area of the bite with an antiseptic solution after removal.  Lyme Disease is a disease that may affect many body systems. Because of the small size of the biting tick, most people do not notice being bitten. The first  sign of an infection is usually a round red rash that extends out from the center of the tick bite. The center of the lesion may be blood colored (hemorrhagic) or have tiny blisters (vesicular). Most lesions have bright red outer borders and partial central clearing. This rash may extend out many inches in diameter, and multiple lesions may be present. Other symptoms such as fatigue, headaches, chills and fever, general achiness and swelling of lymph glands may also occur. If this first stage of the disease is left untreated, these symptoms may gradually resolve by themselves, or progressive symptoms may occur because of spread of infection to other areas of the body.  Follow up with your caregiver to have testing and treatment if you have a tick bite and you develop any of the above complaints. Your caregiver may recommend preventative (prophylactic) medications which kill bacteria (antibiotics). Once a diagnosis of Lyme Disease is made, antibiotic treatment is highly likely to cure the disease. Effective treatment of late stage Lyme Disease may require longer courses of antibiotic therapy.  MAKE SURE YOU:   Understand these instructions.  Will watch your condition.  Will get help right away if you are not doing well or get worse. Document Released: 08/13/2000 Document Revised: 07/30/2011 Document Reviewed: 10/15/2008 [facility] Patient Information 2015 [place], [place]. This information is not intended to replace advice given to you by your health care provider. Make sure you discuss any questions you  have with your health care provider. [place] Spotted Fever [place] Spotted Fever (RMSF) is the oldest known tick-borne disease of people in the [place]. This disease was named because it was first described among people in the [facility] area who had an illness characterized by a rash with red-purple-black spots. This disease is caused by a rickettsia (Rickettsia rickettsii), a  bacteria carried by the tick. The [facility] wood tick and the American dog tick acquire and transmit the RMSF bacteria (pictures NOT actual size). When a larval, nymphal, or adult tick feeds on an infected rodent or larger animal, the tick can become infected. Infected adult ticks then feed on people who may then get RMSF. The tick transmits the disease to humans during a prolonged period of feeding that lasts many hours, days, or even a couple weeks. The bite is painless and frequently goes unnoticed. An infected male tick may also pass the rickettsial bacteria to her eggs that then may mature to be infected adult ticks. The rickettsia that causes RMSF can also get into a person's body through damaged skin. A tick bite is not necessary. People can get RMSF if they crush a tick and get its blood or body fluids on their skin through a small cut or sore.  DIAGNOSIS Diagnosis is made by laboratory tests.  TREATMENT Treatment is with antibiotics (medications that kill rickettsia and other bacteria). Immediate treatment usually prevents death. GEOGRAPHIC RANGE This disease was reported only in the [facility] until 1931. RMSF has more recently been described among individuals in all states except [place], [place], and [place]. The highest reported incidences of RMSF now occur among residents of [place], [place], [place], and the [place]. TIME OF YEAR  Most cases are diagnosed during late spring and summer when ticks are most active. However, especially in the warmer [place] states, a few cases occur during the winter. SYMPTOMS   Symptoms of RMSF begin from 2 to 14 days after a tick bite. The most common early symptoms are fever, muscle aches, and headache followed by nausea (feeling sick to your stomach) or vomiting.  The RMSF rash is typically delayed until 3 or more days after symptom onset, and eventually develops in 9 of 10 infected patients by the fifth day of illness. If the disease is  not treated it can cause death. If you get a fever, headache, muscle aches, rash, nausea, or vomiting within 2 weeks of a possible tick bite or exposure, you should see your caregiver immediately. PREVENTION Ticks prefer to hide in shady, moist ground litter. They can often be found above the ground clinging to tall grass, brush, shrubs and low tree branches. They also inhabit lawns and gardens, especially at the edges of woodlands and around old stone walls. Within the areas where ticks generally live, no naturally vegetated area can be considered completely free of infected ticks. The best precaution against RMSF is to avoid contact with soil, leaf litter, and vegetation as much as possible in tick-infested areas. For those who enjoy gardening or walking in their yards, clear brush and mow tall grass around houses and at the edges of gardens. This may help reduce the tick population in the immediate area. Applications of chemical insecticides by a licensed professional in the spring (late May) and fall (September) will also control ticks, especially in heavily infested areas. Treatment will never get rid of all the ticks. Getting rid of small animal populations that host ticks will also decrease the tick  population. When working in the garden, [facility], or handling soil and vegetation, wear light-colored protective clothing and gloves. Spot-check often to prevent ticks from reaching the skin. Ticks cannot jump or fly. They will not drop from an above-ground perch onto a passing animal. Once a tick gains access to human skin it climbs upward until it reaches a more protected area. For example, the back of the knee, groin, navel, armpit, ears, or nape of the neck. It then begins the slow process of embedding itself in the skin. Campers, hikers, field workers, and others who spend time in wooded, brushy, or tall grassy areas can avoid exposure to ticks by using the following precautions:  Wear  light-colored clothing with a tight weave to spot ticks more easily and prevent contact with the skin.  Wear long pants tucked into socks, long-sleeved shirts tucked into pants and enclosed shoes or boots along with insect repellent.  Spray clothes with insect repellent containing either DEET or Permethrin. Only DEET can be used on exposed skin. Follow the manufacturer's directions carefully.  Wear a hat and keep long hair pulled back.  Stay on cleared, well-worn trails whenever possible.  Spot-check yourself and others often for the presence of ticks on clothes. If you find one, there are likely to be others. Check thoroughly.  Remove clothes after leaving tick-infested areas. If possible, wash them to eliminate any unseen ticks. Check yourself, your children and any pets from head to toe for the presence of ticks.  Shower and shampoo. You can greatly reduce your chances of contracting RMSF if you remove attached ticks as soon as possible. Regular checks of the body, including all body sites covered by hair (head, armpits, genitals), allow removal of the tick before rickettsial transmission. To remove an attached tick, use a forceps or tweezers to detach the intact tick without leaving mouth parts in the skin. The tick bite wound should be cleansed after tick removal. Remember the most common symptoms of RMSF are fever, muscle aches, headache, and nausea or vomiting with a later onset of rash. If you get these symptoms after a tick bite and while living in an area where RMSF is found, RMSF should be suspected. If the disease is not treated, it can cause death. See your caregiver immediately if you get these symptoms. Do this even if not aware of a tick bite. Document Released: 08/19/2000 Document Revised: 09/21/2013 Document Reviewed: 04/11/2009 [facility] Patient Information 2015 [place], [place]. This information is not intended to replace advice given to you by your health care provider. Make sure  you discuss any questions you have with your health care provider.

## 2014-10-12 NOTE — Progress Notes (Signed)
   Subjective:    Patient ID: Juan Bender, male    DOB: 02/22/1966, 49 y.o.   MRN: [id number]  HPI  Patient presents to the office today for itching s/p tick bite two weeks ago. He continues to have slight itching. He does endorse that it is getting better and that he has had some nights that the itching has woken him up.   He denies rash, no fever, nausea or body aches.   Review of Systems   Constitutional: Negative for fever, chills, diaphoresis and fatigue.  Musculoskeletal: Negative.   Skin: Negative.   All other systems reviewed and are negative.  Past Medical History  Diagnosis Date  . Peyronie's disease   . Cancer     melanoma removed x3    History   Social History  . Marital Status: Married    Spouse Name: N/A  . Number of Children: N/A  . Years of Education: N/A   Occupational History  . Not on file.   Social History Main Topics  . Smoking status: Former Smoker    Types: Cigars    Quit date: 12/17/2001  . Smokeless tobacco: Not on file  . Alcohol Use: 3.0 oz/week    5 Glasses of wine per week  . Drug Use: No  . Sexual Activity: Not on file   Other Topics Concern  . Not on file   Social History Narrative    Past Surgical History  Procedure Laterality Date  . Vasectomy    . Eye surgery      lasik  . Melanoma excision  2011    x3  . Nesbit procedure  12/21/2011    Procedure: NESBIT PROCEDURE;  Surgeon: Claybon Jabs, MD;  Location: [facility];  Service: Urology;  Laterality: N/A;  with 16 dot plication      No family history on file.  No Known Allergies  No current outpatient prescriptions on file prior to visit.   No current facility-administered medications on file prior to visit.    BP 98/68 mmHg  Temp(Src) 98.4 F (36.9 C) (Oral)  Ht 6\' 2"  (1.88 m)  Wt 200 lb 6.4 oz (90.901 kg)  BMI 25.72 kg/m2        Objective:   Physical Exam  Constitutional: He is oriented to person, place, and time. He appears  well-nourished. No distress.  Musculoskeletal: Normal range of motion. He exhibits no edema or tenderness.  Neurological: He is alert and oriented to person, place, and time.  Skin: Skin is warm and dry.  Small scab on right lower back. Scab was picked off and no part of a tick was noticed. No rash around area. No swelling or warmth.   Nursing note and vitals reviewed.      Assessment & Plan:  1. Tick bite  - Continue to monitor area for rash - Material given to patient on Lyme Disease and RMSF.  - Follow up with any signs and symptoms.   2. Itching - Hydrocortisone or Benadryl cream for itching.  - Likely due to healing process - Discontinue cleaning with hydrogen peroxide and alcohol  - Follow up if no improvement in a week.

## 2014-10-12 NOTE — Progress Notes (Signed)
Pre visit review using our clinic review tool, if applicable. No additional management support is needed unless otherwise documented below in the visit note. 

## 2014-11-10 ENCOUNTER — Encounter: Payer: Self-pay | Admitting: Family Medicine

## 2014-11-10 ENCOUNTER — Ambulatory Visit (INDEPENDENT_AMBULATORY_CARE_PROVIDER_SITE_OTHER): Payer: BLUE CROSS/BLUE SHIELD | Admitting: Family Medicine

## 2014-11-10 VITALS — BP 130/71 | HR 69 | Temp 98.3°F | Ht 74.0 in | Wt 202.0 lb

## 2014-11-10 DIAGNOSIS — L989 Disorder of the skin and subcutaneous tissue, unspecified: Secondary | ICD-10-CM | POA: Diagnosis not present

## 2014-11-10 MED ORDER — HALOBETASOL PROPIONATE 0.05 % EX CREA: TOPICAL_CREAM | Freq: Two times a day (BID) | CUTANEOUS | Status: DC

## 2014-11-10 NOTE — Progress Notes (Signed)
   Subjective:    Patient ID: Juan Bender, male    DOB: 1966/04/03, 49 y.o.   MRN: [id number]  HPI Here to recheck an itchy spot on the left buttock that seems to be from a tick bite. About 6 weeks ago he pulled a tick off the same site, and it has been irritated ever since. He has used some OTC cortisone cream with no effect. He feels fine in general, no rashes or joint pains or fevers.    Review of Systems  Constitutional: Negative.   Respiratory: Negative.   Cardiovascular: Negative.   Skin: Positive for wound. Negative for rash.  Neurological: Negative.        Objective:   Physical Exam  Constitutional: He appears well-developed and well-nourished.  Skin:  The upper left buttock has a small red papular lesion, this is not tender           Assessment & Plan:  Inflamed tick bite. Try Halobetasol cream bid. Recheck prn

## 2014-11-10 NOTE — Progress Notes (Signed)
Pre visit review using our clinic review tool, if applicable. No additional management support is needed unless otherwise documented below in the visit note. 

## 2014-12-14 ENCOUNTER — Ambulatory Visit (INDEPENDENT_AMBULATORY_CARE_PROVIDER_SITE_OTHER): Payer: BLUE CROSS/BLUE SHIELD | Admitting: Family Medicine

## 2014-12-14 ENCOUNTER — Ambulatory Visit (INDEPENDENT_AMBULATORY_CARE_PROVIDER_SITE_OTHER)
Admission: RE | Admit: 2014-12-14 | Discharge: 2014-12-14 | Disposition: A | Discharge status: Home / Self Care | Payer: BLUE CROSS/BLUE SHIELD | Source: Ambulatory Visit | Attending: Family Medicine | Admitting: Family Medicine

## 2014-12-14 ENCOUNTER — Encounter: Payer: Self-pay | Admitting: Family Medicine

## 2014-12-14 ENCOUNTER — Telehealth: Payer: Self-pay | Admitting: Family Medicine

## 2014-12-14 VITALS — BP 134/88 | HR 60 | Temp 98.2°F | Resp 14 | Ht 74.0 in | Wt 202.0 lb

## 2014-12-14 DIAGNOSIS — R0602 Shortness of breath: Secondary | ICD-10-CM

## 2014-12-14 DIAGNOSIS — R079 Chest pain, unspecified: Secondary | ICD-10-CM

## 2014-12-14 LAB — LIPID PANEL
CHOL/HDL RATIO: 5
CHOLESTEROL: 190 mg/dL (ref 0–200)
HDL: 38.5 mg/dL — AB (ref >39.00)
LDL CALC: 129 mg/dL — AB (ref 0–99)
NonHDL: 151.5
TRIGLYCERIDES: 115 mg/dL (ref 0.0–149.0)
VLDL: 23 mg/dL (ref 0.0–40.0)

## 2014-12-14 LAB — TSH: TSH: 1.19 u[IU]/mL (ref 0.35–4.50)

## 2014-12-14 LAB — COMPREHENSIVE METABOLIC PANEL
ALBUMIN: 5 g/dL (ref 3.5–5.2)
ALK PHOS: 50 U/L (ref 39–117)
ALT: 18 U/L (ref 0–53)
AST: 19 U/L (ref 0–37)
BUN: 18 mg/dL (ref 6–23)
CALCIUM: 9.8 mg/dL (ref 8.4–10.5)
CO2: 27 meq/L (ref 19–32)
CREATININE: 1.1 mg/dL (ref 0.40–1.50)
Chloride: 101 mEq/L (ref 96–112)
GFR: 76.3 mL/min (ref >60.00)
GLUCOSE: 94 mg/dL (ref 70–99)
POTASSIUM: 3.8 meq/L (ref 3.5–5.1)
Sodium: 139 mEq/L (ref 135–145)
TOTAL PROTEIN: 7.8 g/dL (ref 6.0–8.3)
Total Bilirubin: 0.6 mg/dL (ref 0.2–1.2)

## 2014-12-14 LAB — CBC
HEMATOCRIT: 43.8 % (ref 39.0–52.0)
Hemoglobin: 14.6 g/dL (ref 13.0–17.0)
MCHC: 33.3 g/dL (ref 30.0–36.0)
MCV: 88.9 fl (ref 78.0–100.0)
Platelets: 194 10*3/uL (ref 150.0–400.0)
RBC: 4.92 Mil/uL (ref 4.22–5.81)
RDW: 13.3 % (ref 11.5–15.5)
WBC: 5.9 10*3/uL (ref 4.0–10.5)

## 2014-12-14 LAB — HEMOGLOBIN A1C: Hgb A1c MFr Bld: 5.2 % (ref 4.6–6.5)

## 2014-12-14 LAB — LDL CHOLESTEROL, DIRECT: Direct LDL: 131 mg/dL

## 2014-12-14 LAB — TROPONIN I: TNIDX: 0.01 ug/l (ref 0.00–0.06)

## 2014-12-14 NOTE — Patient Instructions (Addendum)
Labs today. EKG is reassuring.   Chest x-ray at [facility] office  If above workup normal, we will plan on doing a stress test. Let's touch base by phone after all of the above has been completed. We will see at that time if we think we can do it on a treadmill or not.

## 2014-12-14 NOTE — Telephone Encounter (Signed)
Patient Name: Juan Bender DOB: Jan 17, 1966 Initial Comment Caller states had incident Thursday when he was Scuba diving, chest pain, sob, ringing in ears, jaw pain. Still not feeling himself. when he walks up stairs he gets SOB. No longer having CP, ringing in ears and jaw pain. tried to make appt but sent to nurse Nurse Assessment Nurse: Marcelline Deist, RN, Kermit Balo Date/Time Eilene Ghazi Time): 12/14/2014 9:25:19 AM Confirm and document reason for call. If symptomatic, describe symptoms. ---Caller states had incident Thursday when he was Scuba divingchest pain, sob, ringing in ears, jaw pain. Still not feeling himself. When he walks up stairs, he gets SOB. No longer having chest pain, ringing in ears and jaw pain. Tried to make appt., but sent to nurse. Has the patient traveled out of the country within the last 30 days? ---Not Applicable Does the patient require triage? ---Yes Related visit to physician within the last 2 weeks? ---No Does the PT have any chronic conditions? (i.e. diabetes, asthma, etc.) ---No Guidelines Guideline Title Affirmed Question Affirmed Notes Breathing Difficulty [1] MILD difficulty breathing (e.g., minimal/no SOB at rest, SOB with walking, pulse <100) AND [2] NEW-onset or WORSE than normal Final Disposition User See Physician within 4 Hours (or PCP triage) Marcelline Deist, RN, Lynda Comments Caller's best contact # is cell - [phone]. He states the episode of SOB, chest pain, etc. occurred while on the surface of the water. The chest pain lasted approx. 10 mins. He is finding himself more SOB when exerting himself, such as walking up stairs. Referrals REFERRED TO PCP OFFICE Disagree/Comply: Comply

## 2014-12-14 NOTE — Telephone Encounter (Signed)
Appointment today at 1:00 with MD Yong Channel

## 2014-12-14 NOTE — Addendum Note (Signed)
Addended by: Marin Olp on: 12/14/2014 07:35 PM   Modules accepted: Level of Service
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
## 2014-12-15 LAB — D-DIMER, QUANTITATIVE (NOT AT ARMC): D DIMER QUANT: 0.35 ug{FEU}/mL (ref 0.00–0.48)

## 2014-12-15 NOTE — Addendum Note (Signed)
Addended by: Marin Olp on: 12/15/2014 01:03 PM   Modules accepted: Orders

## 2014-12-20 ENCOUNTER — Telehealth (HOSPITAL_COMMUNITY): Payer: Self-pay

## 2014-12-20 NOTE — Telephone Encounter (Signed)
Encounter complete. 

## 2014-12-22 ENCOUNTER — Ambulatory Visit (HOSPITAL_COMMUNITY): Payer: BLUE CROSS/BLUE SHIELD | Attending: Cardiology

## 2014-12-22 DIAGNOSIS — R0602 Shortness of breath: Secondary | ICD-10-CM | POA: Diagnosis not present

## 2014-12-22 LAB — MYOCARDIAL PERFUSION IMAGING
CHL CUP MPHR: 174 {beats}/min
CHL CUP NUCLEAR SDS: 3
CHL CUP NUCLEAR SRS: 0
CHL RATE OF PERCEIVED EXERTION: 18
CSEPEDS: 0 s
CSEPEW: 13.3 METS
CSEPPHR: 162 {beats}/min
Exercise duration (min): 11 min
LHR: 0.35
LVDIAVOL: 107 mL
LVSYSVOL: 39 mL
Percent HR: 93 %
Rest HR: 49 {beats}/min
SSS: 3
TID: 0.97

## 2014-12-22 MED ORDER — TECHNETIUM TC 99M SESTAMIBI GENERIC - CARDIOLITE
10.2000 | Freq: Once | INTRAVENOUS | Status: AC | PRN
  Administered 2014-12-22: 10 via INTRAVENOUS

## 2014-12-22 MED ORDER — TECHNETIUM TC 99M SESTAMIBI GENERIC - CARDIOLITE
30.7000 | Freq: Once | INTRAVENOUS | Status: AC | PRN
  Administered 2014-12-22: 30.7 via INTRAVENOUS

## 2014-12-23 ENCOUNTER — Telehealth: Payer: Self-pay | Admitting: Family Medicine

## 2014-12-23 NOTE — Telephone Encounter (Signed)
Patient informed of the results and states he is still having symptoms.  I transferred him to [facility] to schedule an appt.

## 2014-12-23 NOTE — Telephone Encounter (Signed)
Yes can use sda

## 2014-12-23 NOTE — Telephone Encounter (Signed)
Patient called in wanting to follow up with Dr. Yong Channel on his SOB. Patient would like to come in next week. If possible can I use a SDA next week to schedule this patient.

## 2014-12-23 NOTE — Telephone Encounter (Signed)
Pt had stress test done yesterday and would like results. Pt is aware dr hunter is out of office until 12-27-14

## 2014-12-27 ENCOUNTER — Encounter: Payer: Self-pay | Admitting: Family Medicine

## 2014-12-27 ENCOUNTER — Ambulatory Visit (INDEPENDENT_AMBULATORY_CARE_PROVIDER_SITE_OTHER): Payer: BLUE CROSS/BLUE SHIELD | Admitting: Family Medicine

## 2014-12-27 VITALS — BP 130/98 | HR 60 | Temp 97.7°F | Wt 203.0 lb

## 2014-12-27 DIAGNOSIS — R079 Chest pain, unspecified: Secondary | ICD-10-CM | POA: Diagnosis not present

## 2014-12-27 DIAGNOSIS — R0602 Shortness of breath: Secondary | ICD-10-CM

## 2014-12-27 DIAGNOSIS — R5383 Other fatigue: Secondary | ICD-10-CM | POA: Diagnosis not present
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
## 2014-12-27 NOTE — Patient Instructions (Signed)
Medication Instructions:  Same-continue aspirin 81mg  Hold off on cholesterol medicine until cardiology evaluation  Labwork: None  Testing/Procedures: None  Follow-Up: Cardiology appointment at 3:30 PM tomorrow confirmed through Dr. Theodosia Blender Nurse.

## 2014-12-27 NOTE — Progress Notes (Signed)
Garret Reddish, MD  Subjective:  Juan Bender is a 49 y.o. year old very pleasant male patient who presents for follow up of chest pain, shortness of breath and fatigue. No symptoms prior to a scuba diving trip. When he was swimming out to submersion point he developed chest pain and shortness of breath that resolved with rest within 10 minutes. On return, patient had issues with shortness of breath primarily with exertion for activities that normally were not difficult for him such as going up stairs. At baseline he is physically active with things like mowing yard, scuba diving, but no regular exercise over last year.   I saw him on 7/26 and had negative troponin, d-dimer and largely normal EKG and CXR. See other labs from that day as well including normal TSH. I sent him for a myocardial perfusion scan which he did through treadmill for exercise which came back low risk. Patient last visit told me no recurrent chest pain but he now admits he gets some low level pain with exertion. He got some more intense pain 2-3/10 with walking on treadmill. Had persistent pain throughout the evening and seemed to move up into the chest.  He has continued to have issues. Still winded extremely easily. HR picks up with physical activity. Sweats far greater than normal with physical activity like with doing yardwork. Remains fatigued, can't catch breath. Moving a chicken coop that weighed 100 lbs with 4 lifters and he needed a break before anyone else. Drastically out of norm for patient. Winded carrying flower pot that wife did not have difficulty with. No issues with resting or walking.  Mild cough since [place]- reported by wife. No drainage, no fevers.   ROS- no fevers, nausea, no headache, blurry vision  Past Medical History- Sleep apnea no CPAP, no heart history, no history DVT/PE, no leg or calf swelling.  Former smoker - cigars while golfing only Mild Hyperlipidemia Medications-  none Allergies - NKDA  Family history- CAD in father, age 37 had heart attack and later had CABG Social history- no drugs, former smoker quit 2003- cigars with playing golf only  Medications- reviewed and updated Advised again baby aspirin until further evaluation, no meds currently  Objective: BP 130/98 mmHg  Pulse 60  Temp(Src) 97.7 F (36.5 C)  Wt 203 lb (92.08 kg)  SpO2 99% Gen: NAD, resting comfortably CV: RRR no murmurs rubs or gallops Lungs: CTAB no crackles, wheeze, rhonchi Abdomen: soft/nontender/nondistended/normal bowel sounds.   Ext: no edema Skin: warm, dry Neuro: grossly normal, moves all extremities   BP Readings from Last 3 Encounters:  12/27/14 130/98  12/14/14 134/88  11/10/14 130/71  BP previously controlled  Assessment/Plan:  Recurrent exertional chest pain, shortness of breath, diaphoresis and fatigue-relieved by rest 49 year old male with cardiac risks( mild hyperlipidemia, CAD in father but at age 101, former smoker-though cigars only with golf). I am suspicious of cardiac origin despite low risk stress test. Also has had CXR, troponin, d-dimer, and other labs.  I discussed case with Dr. Radford Pax by phone today to see if would suggest cardiac vs. pulm evaluation. She would like patient to be seen by cardiology first and patient has been worked in for [name] tomorrow with [name]. I asked patient to touch base with me Thursday  to make sure we have a plan going forward. Would consider CT chest, PFTs vs. Considering both of these at pulm appointment.Before this, want cards opinion on cath for potential false negative  on stress test.   Strict  ER and Return precautions advised.

## 2014-12-28 ENCOUNTER — Telehealth: Payer: Self-pay | Admitting: Physician Assistant

## 2014-12-28 ENCOUNTER — Ambulatory Visit (INDEPENDENT_AMBULATORY_CARE_PROVIDER_SITE_OTHER): Payer: BLUE CROSS/BLUE SHIELD | Admitting: Physician Assistant

## 2014-12-28 ENCOUNTER — Encounter: Payer: Self-pay | Admitting: *Deleted

## 2014-12-28 ENCOUNTER — Encounter: Payer: Self-pay | Admitting: Physician Assistant

## 2014-12-28 VITALS — BP 120/72 | HR 64 | Ht 74.0 in | Wt 201.4 lb

## 2014-12-28 DIAGNOSIS — R072 Precordial pain: Secondary | ICD-10-CM

## 2014-12-28 DIAGNOSIS — R079 Chest pain, unspecified: Secondary | ICD-10-CM

## 2014-12-28 DIAGNOSIS — Q676 Pectus excavatum: Secondary | ICD-10-CM | POA: Diagnosis not present

## 2014-12-28 LAB — TROPONIN I

## 2014-12-28 MED ORDER — ASPIRIN EC 81 MG PO TBEC: 81.0000 mg | DELAYED_RELEASE_TABLET | Freq: Every day | ORAL | Status: DC

## 2014-12-28 MED ORDER — NITROGLYCERIN 0.4 MG SL SUBL: 0.4000 mg | SUBLINGUAL_TABLET | SUBLINGUAL | Status: DC | PRN

## 2014-12-28 NOTE — Patient Instructions (Addendum)
Medication Instructions:  1. START ASPIRIN 81 MG DAILY  2. AN RX FOR NTG HAS BEEN SENT IN AND YOU HAVE BEEN ADVISED TO HOW AND WHEN TO USE NITROGLYCERIN  Labwork: TODAY BMET, CBC W/DIFF, PT/INR, TSH  Testing/Procedures: Your physician has requested that you have a cardiac catheterization. Cardiac catheterization is used to diagnose and/or treat various heart conditions. Doctors may recommend this procedure for a number of different reasons. The most common reason is to evaluate chest pain. Chest pain can be a symptom of coronary artery disease (CAD), and cardiac catheterization can show whether plaque is narrowing or blocking your heart's arteries. This procedure is also used to evaluate the valves, as well as measure the blood flow and oxygen levels in different parts of your heart. For further information please visit [facility]tn. Please follow instruction sheet, as given.   Follow-Up: 01/20/15 @ 9:30 WITH SCOTT WEAVER, PAC SAME DAY DR. Tamala Julian IS IN THE OFFICE   Any Other Special Instructions Will Be Listed Below (If Applicable).

## 2014-12-28 NOTE — Addendum Note (Signed)
Addended by: Michae Kava on: 12/28/2014 05:17 PM   Modules accepted: Orders

## 2014-12-28 NOTE — Telephone Encounter (Signed)
Paged by [facility] that patient's stat troponin is less that 0.01. Pt is schedule to have [facility] 12/30/14. Pending other labs. Continue as schedule.  Eugenia Eldredge, PA-C

## 2014-12-28 NOTE — Telephone Encounter (Signed)
Troponin result from today negative. Results were called to the patient tonight.  He verbalized understanding. [name], PA-C   12/28/2014 9:12 PM

## 2014-12-29 ENCOUNTER — Telehealth: Payer: Self-pay | Admitting: *Deleted

## 2014-12-29 LAB — CBC WITH DIFFERENTIAL/PLATELET
Basophils Absolute: 0 10*3/uL (ref 0.0–0.1)
Basophils Relative: 0.3 % (ref 0.0–3.0)
EOS PCT: 1.3 % (ref 0.0–5.0)
Eosinophils Absolute: 0.1 10*3/uL (ref 0.0–0.7)
HEMATOCRIT: 44.1 % (ref 39.0–52.0)
HEMOGLOBIN: 14.8 g/dL (ref 13.0–17.0)
LYMPHS ABS: 1.8 10*3/uL (ref 0.7–4.0)
Lymphocytes Relative: 32.1 % (ref 12.0–46.0)
MCHC: 33.6 g/dL (ref 30.0–36.0)
MCV: 88.3 fl (ref 78.0–100.0)
MONOS PCT: 3.5 % (ref 3.0–12.0)
Monocytes Absolute: 0.2 10*3/uL (ref 0.1–1.0)
NEUTROS ABS: 3.5 10*3/uL (ref 1.4–7.7)
Neutrophils Relative %: 62.8 % (ref 43.0–77.0)
Platelets: 202 10*3/uL (ref 150.0–400.0)
RBC: 4.99 Mil/uL (ref 4.22–5.81)
RDW: 12.6 % (ref 11.5–15.5)
WBC: 5.5 10*3/uL (ref 4.0–10.5)

## 2014-12-29 LAB — BASIC METABOLIC PANEL
BUN: 13 mg/dL (ref 6–23)
CALCIUM: 9.9 mg/dL (ref 8.4–10.5)
CHLORIDE: 103 meq/L (ref 96–112)
CO2: 28 mEq/L (ref 19–32)
CREATININE: 1.2 mg/dL (ref 0.40–1.50)
GFR: 69 mL/min (ref >60.00)
Glucose, Bld: 95 mg/dL (ref 70–99)
Potassium: 4.2 mEq/L (ref 3.5–5.1)
Sodium: 140 mEq/L (ref 135–145)

## 2014-12-29 LAB — TSH: TSH: 1.17 u[IU]/mL (ref 0.35–4.50)

## 2014-12-29 LAB — PROTIME-INR
INR: 1 ratio (ref 0.8–1.0)
PROTHROMBIN TIME: 11.4 s (ref 9.6–13.1)

## 2014-12-29 NOTE — Progress Notes (Signed)
ADDENDUM:  Patient did note significant hx of snoring.  His wife has witnessed apnea in the past.  We should consider arranging a sleep study at his FU office visit.   [name], PA-C   12/29/2014 8:50 AM

## 2014-12-29 NOTE — Telephone Encounter (Signed)
Pt notified by phone of lab results ok for cath.

## 2014-12-30 ENCOUNTER — Encounter (HOSPITAL_COMMUNITY): Payer: Self-pay | Admitting: Cardiovascular Disease

## 2014-12-30 ENCOUNTER — Encounter (HOSPITAL_COMMUNITY)
Admission: RE | Disposition: A | Discharge status: Home / Self Care | Payer: Self-pay | Source: Ambulatory Visit | Attending: Cardiovascular Disease

## 2014-12-30 ENCOUNTER — Ambulatory Visit (HOSPITAL_COMMUNITY)
Admission: RE | Admit: 2014-12-30 | Discharge: 2014-12-30 | Disposition: A | Discharge status: Home / Self Care | Payer: BLUE CROSS/BLUE SHIELD | Source: Ambulatory Visit | Attending: Cardiovascular Disease | Admitting: Cardiovascular Disease

## 2014-12-30 DIAGNOSIS — R072 Precordial pain: Secondary | ICD-10-CM | POA: Diagnosis present

## 2014-12-30 HISTORY — PX: CARDIAC CATHETERIZATION: SHX172

## 2014-12-30 SURGERY — LEFT HEART CATH AND CORONARY ANGIOGRAPHY
Anesthesia: LOCAL

## 2014-12-30 MED ORDER — SODIUM CHLORIDE 0.9 % IJ SOLN: 3.0000 mL | INTRAMUSCULAR | Status: DC | PRN

## 2014-12-30 MED ORDER — SODIUM CHLORIDE 0.9 % IV SOLN
INTRAVENOUS | Status: AC
Start: 2014-12-30 — End: 2014-12-30

## 2014-12-30 MED ORDER — IOHEXOL 350 MG/ML SOLN
INTRAVENOUS | Status: DC | PRN
  Administered 2014-12-30: 100 mL via INTRA_ARTERIAL

## 2014-12-30 MED ORDER — SODIUM CHLORIDE 0.9 % IV SOLN: 250.0000 mL | INTRAVENOUS | Status: DC | PRN

## 2014-12-30 MED ORDER — VERAPAMIL HCL 2.5 MG/ML IV SOLN
INTRAVENOUS | Status: AC
Start: 2014-12-30 — End: 2014-12-30
  Filled 2014-12-30: qty 2

## 2014-12-30 MED ORDER — MIDAZOLAM HCL 2 MG/2ML IJ SOLN
INTRAMUSCULAR | Status: AC
  Filled 2014-12-30: qty 4

## 2014-12-30 MED ORDER — SODIUM CHLORIDE 0.9 % IJ SOLN: 3.0000 mL | Freq: Two times a day (BID) | INTRAMUSCULAR | Status: DC

## 2014-12-30 MED ORDER — VERAPAMIL HCL 2.5 MG/ML IV SOLN
INTRAVENOUS | Status: DC | PRN
  Administered 2014-12-30: 08:00:00 via INTRA_ARTERIAL

## 2014-12-30 MED ORDER — LIDOCAINE HCL (PF) 1 % IJ SOLN
INTRAMUSCULAR | Status: DC | PRN
  Administered 2014-12-30: 08:00:00

## 2014-12-30 MED ORDER — FENTANYL CITRATE (PF) 100 MCG/2ML IJ SOLN
INTRAMUSCULAR | Status: DC | PRN
  Administered 2014-12-30: 50 ug via INTRAVENOUS

## 2014-12-30 MED ORDER — FENTANYL CITRATE (PF) 100 MCG/2ML IJ SOLN
INTRAMUSCULAR | Status: AC
  Filled 2014-12-30: qty 4

## 2014-12-30 MED ORDER — ASPIRIN 81 MG PO CHEW: 81.0000 mg | CHEWABLE_TABLET | ORAL | Status: DC

## 2014-12-30 MED ORDER — HEPARIN SODIUM (PORCINE) 1000 UNIT/ML IJ SOLN
INTRAMUSCULAR | Status: AC
  Filled 2014-12-30: qty 1

## 2014-12-30 MED ORDER — SODIUM CHLORIDE 0.9 % IV SOLN
INTRAVENOUS | Status: DC
  Administered 2014-12-30: 06:00:00 via INTRAVENOUS

## 2014-12-30 MED ORDER — LIDOCAINE HCL (PF) 1 % IJ SOLN
INTRAMUSCULAR | Status: AC
  Filled 2014-12-30: qty 30

## 2014-12-30 MED ORDER — MIDAZOLAM HCL 2 MG/2ML IJ SOLN
INTRAMUSCULAR | Status: DC | PRN
  Administered 2014-12-30: 2 mg via INTRAVENOUS

## 2014-12-30 MED ORDER — HEPARIN SODIUM (PORCINE) 1000 UNIT/ML IJ SOLN
INTRAMUSCULAR | Status: DC | PRN
  Administered 2014-12-30: 4500 [IU] via INTRAVENOUS

## 2014-12-30 SURGICAL SUPPLY — 11 items

## 2014-12-30 NOTE — H&P (View-Only) (Signed)
ADDENDUM:  Patient did note significant hx of snoring.  His wife has witnessed apnea in the past.  We should consider arranging a sleep study at his FU office visit.   [name], PA-C   12/29/2014 8:50 AM

## 2014-12-30 NOTE — Discharge Instructions (Signed)
Radial Site Care °Refer to this sheet in the next few weeks. These instructions provide you with information on caring for yourself after your procedure. Your caregiver may also give you more specific instructions. Your treatment has been planned according to current medical practices, but problems sometimes occur. Call your caregiver if you have any problems or questions after your procedure. °HOME CARE INSTRUCTIONS °· You may shower the day after the procedure. Remove the bandage (dressing) and gently wash the site with plain soap and water. Gently pat the site dry. °· Do not apply powder or lotion to the site. °· Do not submerge the affected site in water for 3 to 5 days. °· Inspect the site at least twice daily. °· Do not flex or bend the affected arm for 24 hours. °· No lifting over 5 pounds (2.3 kg) for 5 days after your procedure. °· Do not drive home if you are discharged the same day of the procedure. Have someone else drive you. °· You may drive 24 hours after the procedure unless otherwise instructed by your caregiver. °· Do not operate machinery or power tools for 24 hours. °· A responsible adult should be with you for the first 24 hours after you arrive home. °What to expect: °· Any bruising will usually fade within 1 to 2 weeks. °· Blood that collects in the tissue (hematoma) may be painful to the touch. It should usually decrease in size and tenderness within 1 to 2 weeks. °SEEK IMMEDIATE MEDICAL CARE IF: °· You have unusual pain at the radial site. °· You have redness, warmth, swelling, or pain at the radial site. °· You have drainage (other than a small amount of blood on the dressing). °· You have chills. °· You have a fever or persistent symptoms for more than 72 hours. °· You have a fever and your symptoms suddenly get worse. °· Your arm becomes pale, cool, tingly, or numb. °· You have heavy bleeding from the site. Hold pressure on the site. °Document Released: 06/09/2010 Document Revised:  07/30/2011 Document Reviewed: 06/09/2010 °ExitCare® Patient Information ©2015 [employer]. This information is not intended to replace advice given to you by your health care provider. Make sure you discuss any questions you have with your health care provider. ° °

## 2014-12-30 NOTE — Interval H&P Note (Signed)
History and Physical Interval Note:  12/30/2014 7:20 AM  Juan Bender  has presented today for cardiac cath with the diagnosis of excertional angina  The various methods of treatment have been discussed with the patient and family. After consideration of risks, benefits and other options for treatment, the patient has consented to  Procedure(s): Left Heart Cath and Coronary Angiography (N/A) as a surgical intervention .  The patient's history has been reviewed, patient examined, no change in status, stable for surgery.  I have reviewed the patient's chart and labs.  Questions were answered to the patient's satisfaction.    Cath Lab Visit (complete for each Cath Lab visit)  Clinical Evaluation Leading to the Procedure:   ACS: No.  Non-ACS:    Anginal Classification: CCS III  Anti-ischemic medical therapy: No Therapy  Non-Invasive Test Results: Low risk  Prior CABG: No previous CABG         MCALHANY,CHRISTOPHER

## 2015-01-05 ENCOUNTER — Encounter: Payer: Self-pay | Admitting: Physician Assistant

## 2015-01-05 ENCOUNTER — Other Ambulatory Visit: Payer: Self-pay

## 2015-01-05 ENCOUNTER — Ambulatory Visit (HOSPITAL_COMMUNITY): Payer: BLUE CROSS/BLUE SHIELD | Attending: Cardiovascular Disease

## 2015-01-05 DIAGNOSIS — R072 Precordial pain: Secondary | ICD-10-CM

## 2015-01-05 DIAGNOSIS — I351 Nonrheumatic aortic (valve) insufficiency: Secondary | ICD-10-CM | POA: Diagnosis not present

## 2015-01-05 DIAGNOSIS — I071 Rheumatic tricuspid insufficiency: Secondary | ICD-10-CM | POA: Insufficient documentation

## 2015-01-05 DIAGNOSIS — Z8249 Family history of ischemic heart disease and other diseases of the circulatory system: Secondary | ICD-10-CM | POA: Insufficient documentation

## 2015-01-05 DIAGNOSIS — I371 Nonrheumatic pulmonary valve insufficiency: Secondary | ICD-10-CM | POA: Insufficient documentation

## 2015-01-06 ENCOUNTER — Telehealth: Payer: Self-pay | Admitting: *Deleted

## 2015-01-06 NOTE — Telephone Encounter (Signed)
Ptcb and has been notified of echo results by phone with verbal understanding. I verified appt 9/1 w/Scott W. @ 9:30, pt said he will look into that. I advised pt that this was made for a f/u s/p cath.

## 2015-01-07 ENCOUNTER — Telehealth: Payer: Self-pay | Admitting: Family Medicine

## 2015-01-07 DIAGNOSIS — R0602 Shortness of breath: Secondary | ICD-10-CM

## 2015-01-07 NOTE — Telephone Encounter (Signed)
From your last note it looks like pt was supposed to reach out to you last Thursday to discuss a plan going forward but he is just calling today. See below.

## 2015-01-07 NOTE — Telephone Encounter (Signed)
Pt said he has seen Dr Yong Channel for his last few visits and would like to discuss his next course of action.

## 2015-01-10 NOTE — Telephone Encounter (Signed)
Referral to pulmonology placed.

## 2015-01-10 NOTE — Telephone Encounter (Signed)
If patient is having continued issues with shortness of breath, let's get him in with pulmonology for further evaluation, they may do breathing tests as well as in depth imaging such as CT

## 2015-01-19 NOTE — Progress Notes (Signed)
Cardiology Office Note   Date:  01/20/2015   ID:  Juan Bender, DOB Jan 18, 1966, MRN [id number]  PCP:  Joycelyn Man, MD  Cardiologist:  New - Dr. Daneen Schick   Electrophysiologist:  n/a  Chief Complaint  Patient presents with  . Chest Pain    s/p cardiac cath     History of Present Illness: Juan Bender is a 49 y.o. male with a hx of pectus excavatum.  He has no hx of heart disease.  He is a non-smoker.  He is married with 3 children (19, 48, 1) and works in Aeronautical engineer.  His father had an MI and CABG at age 74.  The patient has no hx of CAD, HTN, HL, DM2.    He was initially seen 12/28/14 for the evaluation of left-sided chest pressure, dyspnea. Primary care arranged stress testing which was low risk. With continued symptoms, we elected to proceed with cardiac catheterization. This was performed 12/30/14 and demonstrated no angiographic evidence of CAD and normal caliber aortic root without evidence of dissection. No further ischemic workup was felt to be necessary. Patient does have a history of pectus excavatum. Echocardiogram was obtained and demonstrated EF 65-70% and normal diastolic function. RV function was normal and pulmonary artery systolic pressure was within normal range. He returns for follow-up.   Overall, he is feeling better. He notes much less fatigue. He continues to have left-sided chest discomfort that seems to come on with activity. This seems to be associated with dyspnea. He denies orthopnea, PND or edema. He denies syncope. Primary care has referred him to pulmonology.   Studies/Reports Reviewed Today:  Echo 01/05/15 Mild LVH, vigorous LVF, EF 65-70%, normal wall motion, normal diastolic function, trivial AI, trivial TR, trivial PI  LHC 12/30/14 No CAD EF 55-65% Normal caliber aortic root with no evidence of dissection  Myoview 12/22/14 Nuclear stress EF: 63%.  Low risk study with no prior infarct or ischemia.     Past Medical History    Diagnosis Date  . Peyronie's disease   . Cancer     melanoma removed x3  . History of echocardiogram     Echo 8/16: Mild LVH, vigorous LVF, EF 65-70%, normal wall motion, normal diastolic function, normal RV function, trivial AI, trivial PI, trivial TR  . History of cardiac catheterization     a. LHC 8/16:  no CAD, EF 55-65%, Normal caliber aortic root with no evidence of dissection  . History of nuclear stress test     a. Myoview 8/16:  Nuclear stress EF: 63%.  Low risk study with no prior infarct or ischemia.   . Pectus excavatum     Past Surgical History  Procedure Laterality Date  . Vasectomy    . Eye surgery      lasik  . Melanoma excision  2011    x3  . Nesbit procedure  12/21/2011    Procedure: NESBIT PROCEDURE;  Surgeon: Claybon Jabs, MD;  Location: [facility];  Service: Urology;  Laterality: N/A;  with 16 dot plication    . Cardiac catheterization N/A 12/30/2014    Procedure: Left Heart Cath and Coronary Angiography;  Surgeon: Burnell Blanks, MD;  Location: [facility];  Service: Cardiovascular;  Laterality: N/A;    Medications: Current Outpatient Prescriptions  Medication Sig Dispense Refill  . aspirin EC 81 MG tablet Take 1 tablet (81 mg total) by mouth daily. (Patient not taking: Reported on 01/20/2015)  No current facility-administered medications for this visit.    Allergies:   Review of patient's allergies indicates no known allergies.    Social History:  The patient  reports that he quit smoking about 13 years ago. His smoking use included Cigars. He has never used smokeless tobacco. He reports that he drinks about 3.0 oz of alcohol per week. He reports that he does not use illicit drugs.   Family History:  The patient's family history includes Heart attack (age of onset: 22) in his father; Heart disease in his father and mother; Hyperlipidemia in his father; Hypertension in his father.    ROS:   Please see the history of  present illness.   Review of Systems  Constitution: Positive for diaphoresis and malaise/fatigue.  Cardiovascular: Positive for chest pain and dyspnea on exertion.  Respiratory: Positive for cough and snoring.   All other systems reviewed and are negative.     PHYSICAL EXAM: VS:  BP 120/72 mmHg  Pulse 58  Ht 6\' 2"  (1.88 m)  Wt 198 lb 1.9 oz (89.867 kg)  BMI 25.43 kg/m2  SpO2 97%    Wt Readings from Last 3 Encounters:  01/20/15 198 lb 1.9 oz (89.867 kg)  12/30/14 200 lb (90.719 kg)  12/28/14 201 lb 6.4 oz (91.354 kg)     GEN: Well nourished, well developed, in no acute distress HEENT: normal Neck: no JVD,no masses Chest:  Pectus Excavatum  Cardiac:  Normal S1/S2, RRR; no murmur ,  no rubs or gallops, no edema; right wrist without hematoma or mass    Respiratory:  clear to auscultation bilaterally, no wheezing, rhonchi or rales. GI: soft, nontender, nondistended, + BS MS: no deformity or atrophy Skin: warm and dry  Neuro:  CNs II-XII intact, Strength and sensation are intact Psych: Normal affect   EKG:  EKG is ordered today.  It demonstrates:   Sinus bradycardia, HR 58, normal axis, IVCD, RSR' V1-V2, nonspecific ST-T wave changes, no change from prior tracing   Recent Labs: 12/14/2014: ALT 18 12/28/2014: BUN 13; Creatinine, Ser 1.20; Hemoglobin 14.8; Platelets 202.0; Potassium 4.2; Sodium 140; TSH 1.17    Lipid Panel    Component Value Date/Time   CHOL 190 12/14/2014 1403   TRIG 115.0 12/14/2014 1403   HDL 38.50* 12/14/2014 1403   CHOLHDL 5 12/14/2014 1403   VLDL 23.0 12/14/2014 1403   LDLCALC 129* 12/14/2014 1403   LDLDIRECT 131.0 12/14/2014 1403      ASSESSMENT AND PLAN:  Precordial pain -  Recent cardiac catheterization with no evidence of CAD. Echocardiogram demonstrated normal LV function with no wall motion abnormalities. No further ischemic workup needed.  He has been referred to pulmonology. I agree with this and recommend that he see the pulmonologist  for further evaluation and management.  Pectus excavatum:    As noted, recent echocardiogram with normal LV systolic and diastolic function. No evidence of pulmonary hypertension.  Snoring:  Consider sleep study.  As he is seeing pulmonology soon, will defer testing to their discretion.    Medication Changes: Current medicines are reviewed at length with the patient today.  Concerns regarding medicines are as outlined above.  The following changes have been made:   Discontinued Medications   No medications on file   Modified Medications   No medications on file   New Prescriptions   No medications on file    Labs/ tests ordered today include:   Orders Placed This Encounter  Procedures  . EKG 12-Lead  Disposition:   FU with Dr. Daneen Schick when necessary in the future.     Signed, Versie Starks, MHS 01/20/2015 10:46 AM    [facility] [address], [place], [place]  [address] Phone: [phone]; Fax: [phone]

## 2015-01-20 ENCOUNTER — Encounter: Payer: Self-pay | Admitting: Physician Assistant

## 2015-01-20 ENCOUNTER — Ambulatory Visit (INDEPENDENT_AMBULATORY_CARE_PROVIDER_SITE_OTHER): Payer: BLUE CROSS/BLUE SHIELD | Admitting: Physician Assistant

## 2015-01-20 VITALS — BP 120/72 | HR 58 | Ht 74.0 in | Wt 198.1 lb

## 2015-01-20 DIAGNOSIS — Q676 Pectus excavatum: Secondary | ICD-10-CM | POA: Diagnosis not present

## 2015-01-20 DIAGNOSIS — R072 Precordial pain: Secondary | ICD-10-CM | POA: Diagnosis not present

## 2015-01-20 DIAGNOSIS — R0683 Snoring: Secondary | ICD-10-CM

## 2015-01-20 NOTE — Patient Instructions (Signed)
Medication Instructions:  Your physician recommends that you continue on your current medications as directed. Please refer to the Current Medication list given to you today.   Labwork: NONE  Testing/Procedures: NONE  Follow-Up: DR. Tamala Julian AS NEEDED  Any Other Special Instructions Will Be Listed Below (If Applicable).

## 2015-01-25 ENCOUNTER — Encounter (INDEPENDENT_AMBULATORY_CARE_PROVIDER_SITE_OTHER): Payer: Self-pay

## 2015-01-25 ENCOUNTER — Ambulatory Visit (INDEPENDENT_AMBULATORY_CARE_PROVIDER_SITE_OTHER): Payer: BLUE CROSS/BLUE SHIELD | Admitting: Pulmonary Disease

## 2015-01-25 ENCOUNTER — Encounter: Payer: Self-pay | Admitting: Pulmonary Disease

## 2015-01-25 VITALS — BP 122/74 | HR 77 | Ht 74.0 in | Wt 199.0 lb

## 2015-01-25 DIAGNOSIS — R072 Precordial pain: Secondary | ICD-10-CM | POA: Diagnosis not present

## 2015-01-25 DIAGNOSIS — R079 Chest pain, unspecified: Secondary | ICD-10-CM | POA: Diagnosis not present

## 2015-01-25 NOTE — Patient Instructions (Signed)
We will arrange a CT scan of your chest in call you with the results. If the results are normal then we will lineup a pulmonary function test. We will see you back if any of these results are abnormal or if the pain does not go away. In the meantime I recommend that you increase her exercise routine.
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
## 2015-01-26 ENCOUNTER — Telehealth: Payer: Self-pay | Admitting: Pulmonary Disease

## 2015-01-26 ENCOUNTER — Ambulatory Visit (INDEPENDENT_AMBULATORY_CARE_PROVIDER_SITE_OTHER)
Admission: RE | Admit: 2015-01-26 | Discharge: 2015-01-26 | Disposition: A | Discharge status: Home / Self Care | Payer: BLUE CROSS/BLUE SHIELD | Source: Ambulatory Visit | Attending: Pulmonary Disease | Admitting: Pulmonary Disease

## 2015-01-26 DIAGNOSIS — R079 Chest pain, unspecified: Secondary | ICD-10-CM | POA: Diagnosis not present

## 2015-01-26 MED ORDER — IOHEXOL 350 MG/ML SOLN
80.0000 mL | Freq: Once | INTRAVENOUS | Status: AC | PRN
  Administered 2015-01-26: 80 mL via INTRAVENOUS

## 2015-01-26 NOTE — Telephone Encounter (Signed)
Juan Bender took call report from [facility]-CT Angio negative for PE.  Pt has been sent home.  Imaging and results available in Epic.  Forwarding to BQ as FYI.

## 2015-01-27 NOTE — Telephone Encounter (Signed)
thanks

## 2015-02-01 ENCOUNTER — Telehealth: Payer: Self-pay | Admitting: Pulmonary Disease

## 2015-02-01 ENCOUNTER — Ambulatory Visit (INDEPENDENT_AMBULATORY_CARE_PROVIDER_SITE_OTHER): Payer: BLUE CROSS/BLUE SHIELD | Admitting: Pulmonary Disease

## 2015-02-01 DIAGNOSIS — R0600 Dyspnea, unspecified: Secondary | ICD-10-CM | POA: Diagnosis not present

## 2015-02-01 LAB — PULMONARY FUNCTION TEST
DL/VA % PRED: 118 %
DL/VA: 5.76 ml/min/mmHg/L
DLCO UNC: 34.73 ml/min/mmHg
DLCO unc % pred: 90 %
FEF 25-75 POST: 4.51 L/s
FEF 25-75 Pre: 2.92 L/sec
FEF2575-%Change-Post: 54 %
FEF2575-%PRED-POST: 111 %
FEF2575-%PRED-PRE: 71 %
FEV1-%CHANGE-POST: 14 %
FEV1-%PRED-PRE: 68 %
FEV1-%Pred-Post: 78 %
FEV1-Post: 3.61 L
FEV1-Pre: 3.15 L
FEV1FVC-%Change-Post: 2 %
FEV1FVC-%Pred-Pre: 101 %
FEV6-%CHANGE-POST: 11 %
FEV6-%PRED-POST: 77 %
FEV6-%PRED-PRE: 69 %
FEV6-POST: 4.44 L
FEV6-PRE: 3.97 L
FEV6FVC-%PRED-POST: 103 %
FEV6FVC-%Pred-Pre: 103 %
FVC-%Change-Post: 11 %
FVC-%PRED-POST: 74 %
FVC-%Pred-Pre: 67 %
FVC-POST: 4.44 L
FVC-Pre: 3.97 L
POST FEV1/FVC RATIO: 81 %
Post FEV6/FVC ratio: 100 %
Pre FEV1/FVC ratio: 79 %
Pre FEV6/FVC Ratio: 100 %
RV % pred: 65 %
RV: 1.46 L
TLC % pred: 73 %
TLC: 5.8 L

## 2015-02-01 NOTE — Telephone Encounter (Signed)
I called him today to let them know that his lung function test showed mild restrictive lung disease which is likely secondary to his pectus excavatum. He says that he is not have dyspnea anymore. I advised him to follow-up with [place] on an as-needed basis.

## 2015-02-01 NOTE — Progress Notes (Signed)
PFT done today. 

## 2015-02-01 NOTE — Telephone Encounter (Signed)
Notes Recorded by Len Blalock, CMA on 01/27/2015 at 4:03 PM lmtcb X1 for pt. Will order pft after speaking to pt. ------  Notes Recorded by Juanito Doom, MD on 01/27/2015 at 2:23 PM A, Please let the patient know this was OK I want him to have a PFT> reason dyspnea. ASAP Thanks, B ----  I spoke with patient about results and he verbalized understanding and had no questions. PFT scheduled for today at 1. Nothing further needed

## 2016-02-17 ENCOUNTER — Ambulatory Visit (INDEPENDENT_AMBULATORY_CARE_PROVIDER_SITE_OTHER): Payer: BLUE CROSS/BLUE SHIELD | Admitting: Family Medicine

## 2016-02-17 ENCOUNTER — Encounter: Payer: Self-pay | Admitting: Family Medicine

## 2016-02-17 VITALS — BP 120/88 | HR 62 | Temp 98.0°F | Wt 195.4 lb

## 2016-02-17 DIAGNOSIS — R21 Rash and other nonspecific skin eruption: Secondary | ICD-10-CM

## 2016-02-17 MED ORDER — PREDNISONE 20 MG PO TABS: ORAL_TABLET | ORAL | 0 refills | Status: DC

## 2016-02-17 NOTE — Progress Notes (Signed)
Pre visit review using our clinic review tool, if applicable. No additional management support is needed unless otherwise documented below in the visit note. 
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
## 2016-02-17 NOTE — Patient Instructions (Addendum)
Unclear etiology. Prednisone 2 week taper to see if we can help this. If you are not noting significant improvement by mid next week (at least in terms of itching), let me put in a referral to dermatology for you or you could try to get into Dr. Sherren Mocha for another opinion- hes only here through Wednesday am though

## 2016-04-25 ENCOUNTER — Ambulatory Visit (INDEPENDENT_AMBULATORY_CARE_PROVIDER_SITE_OTHER): Payer: BLUE CROSS/BLUE SHIELD | Admitting: Family Medicine

## 2016-04-25 ENCOUNTER — Encounter: Payer: Self-pay | Admitting: Family Medicine

## 2016-04-25 VITALS — BP 100/70 | HR 61 | Temp 98.3°F | Ht 74.0 in | Wt 193.6 lb

## 2016-04-25 DIAGNOSIS — N451 Epididymitis: Secondary | ICD-10-CM

## 2016-04-25 MED ORDER — TRAMADOL HCL 50 MG PO TABS
50.0000 mg | ORAL_TABLET | Freq: Three times a day (TID) | ORAL | 0 refills | Status: DC | PRN
Start: 2016-04-25 — End: 2018-06-06

## 2016-04-25 MED ORDER — DOXYCYCLINE HYCLATE 100 MG PO TABS: 100.0000 mg | ORAL_TABLET | Freq: Two times a day (BID) | ORAL | 2 refills | Status: DC

## 2016-04-25 NOTE — Patient Instructions (Signed)
Doxycycline 100 mg,,,,,,,,,,, one twice daily for 10 days repeat if necessary  Tramadol 50 mg,,,,,,,,, 1/2-1 tablet every 6-8 hours as needed for severe pain  Motrin 600 mg twice daily with food for minor pain  Return when necessary

## 2016-04-25 NOTE — Progress Notes (Signed)
Juan Bender is a 50 year old male who comes in today for evaluation of pain in his left testicle for 4 days  On Sunday he was raking leaves and senescent soreness in his left testicle. He has no fever chills nausea vomiting or diarrhea. No history of trauma. He's never had anything like this in the past.  You have systems otherwise negative  Vital signs stable he is afebrile  Examination genitalia shows tenderness at the epididymis. No hernia or masses  Impression epididymitis left  Plan......... doxycycline 100 mg twice a day

## 2016-07-21 IMAGING — CR DG CHEST 2V
3 series · 3 of 3 positions shown · non-contrast
Comparison: September 10, 2013

CLINICAL DATA: Cough for 2 weeks. Shortness of breath with exertion
for 1 week

EXAM:
CHEST  2 VIEW

[view not recorded (1 of 3)]
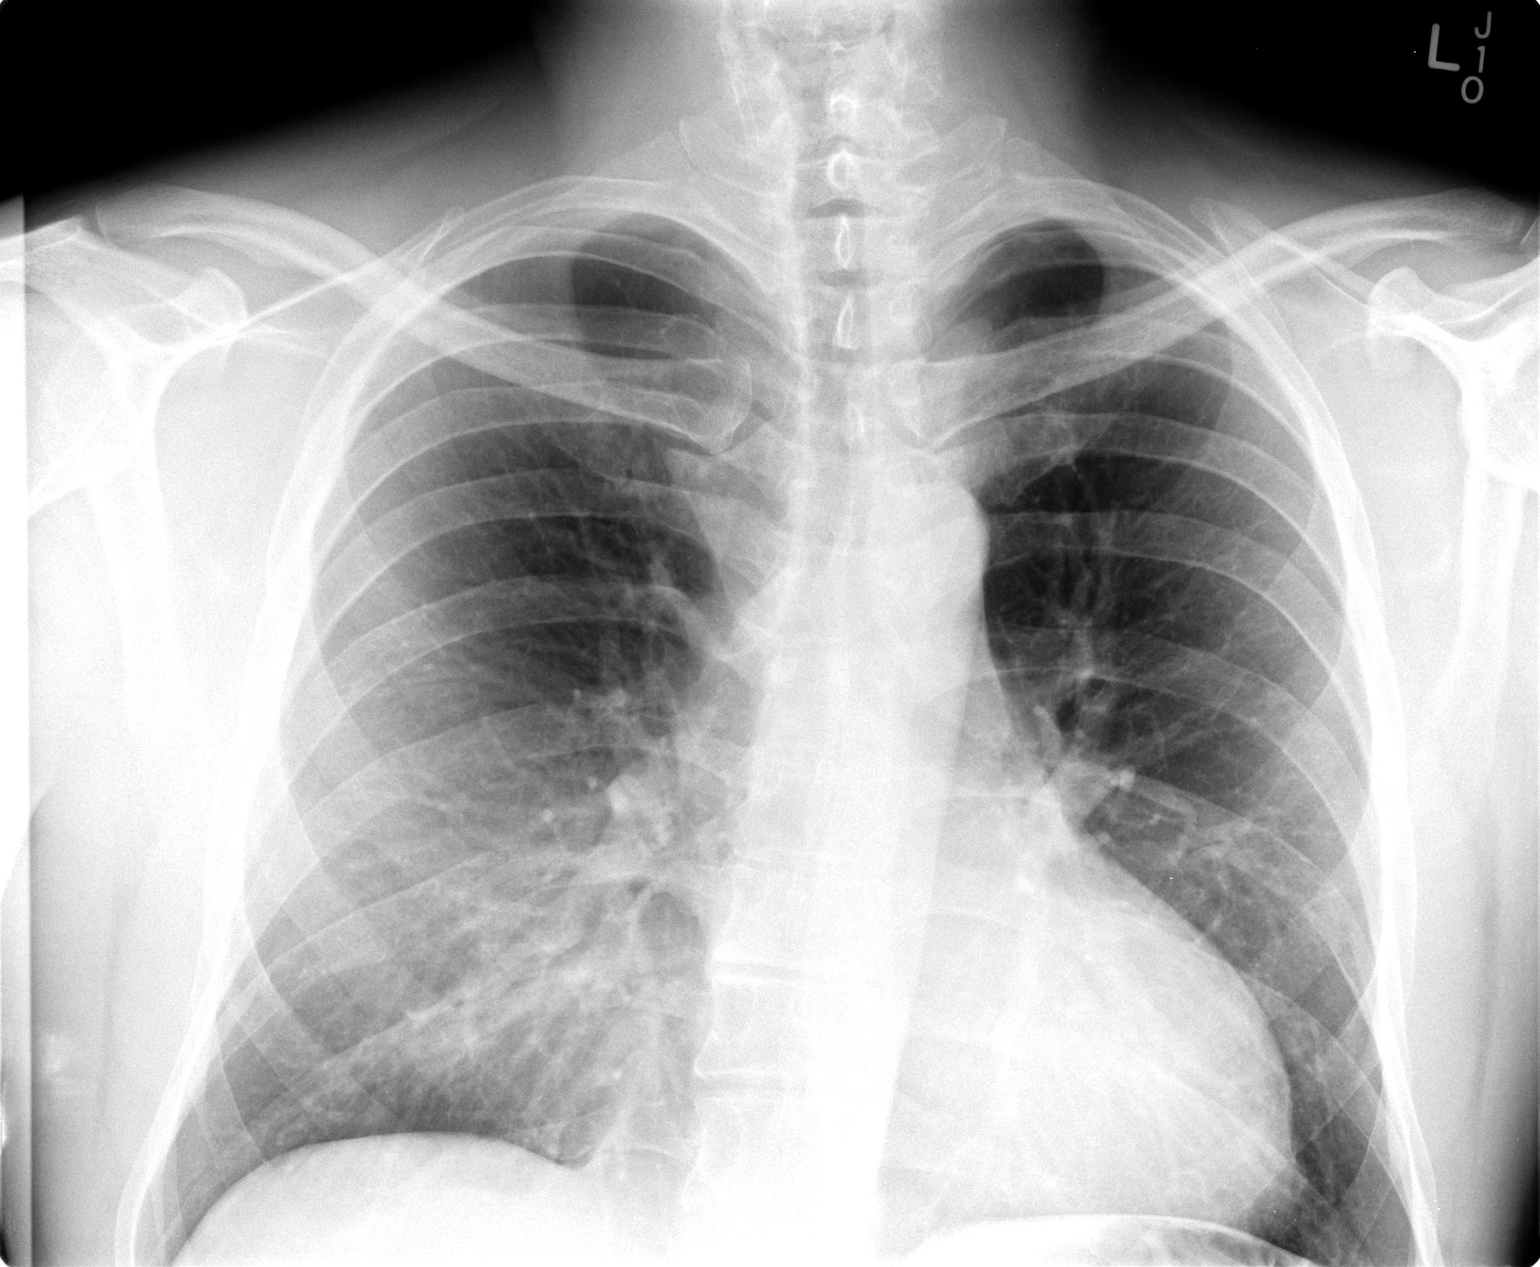

[view not recorded (2 of 3)]
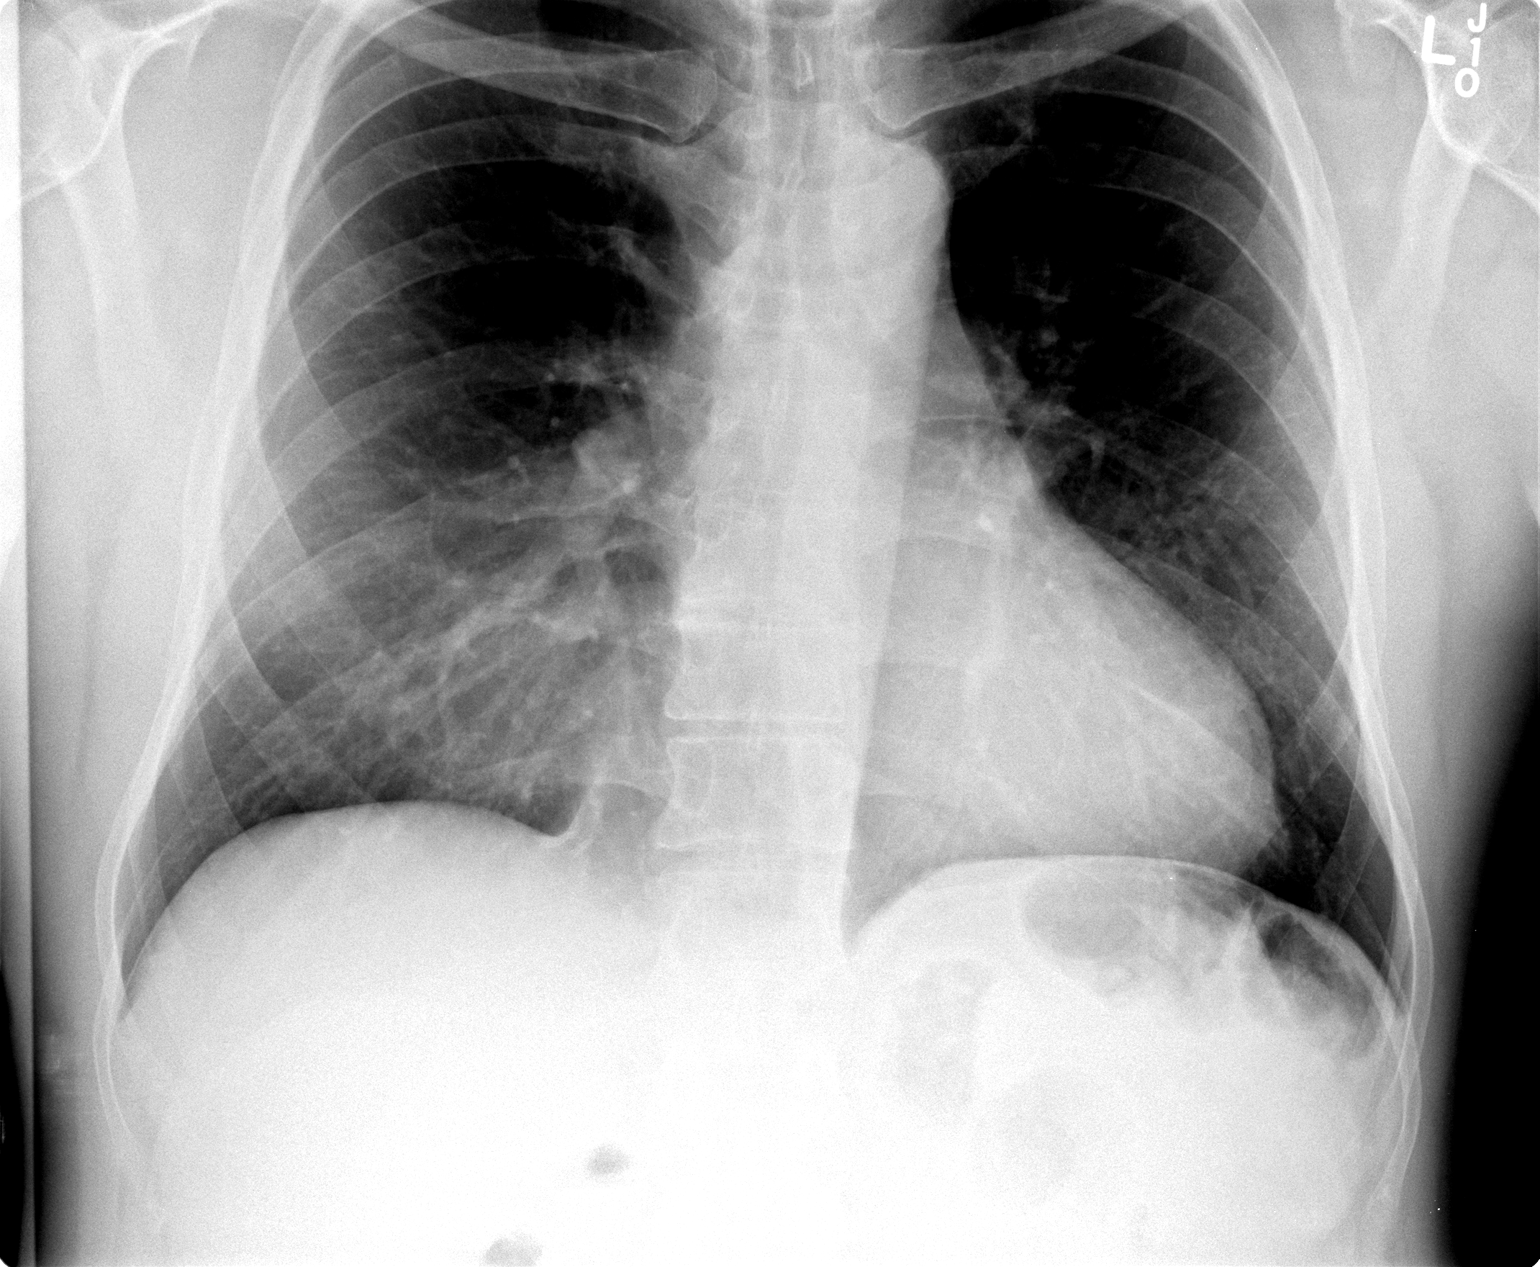

[view not recorded (3 of 3)]
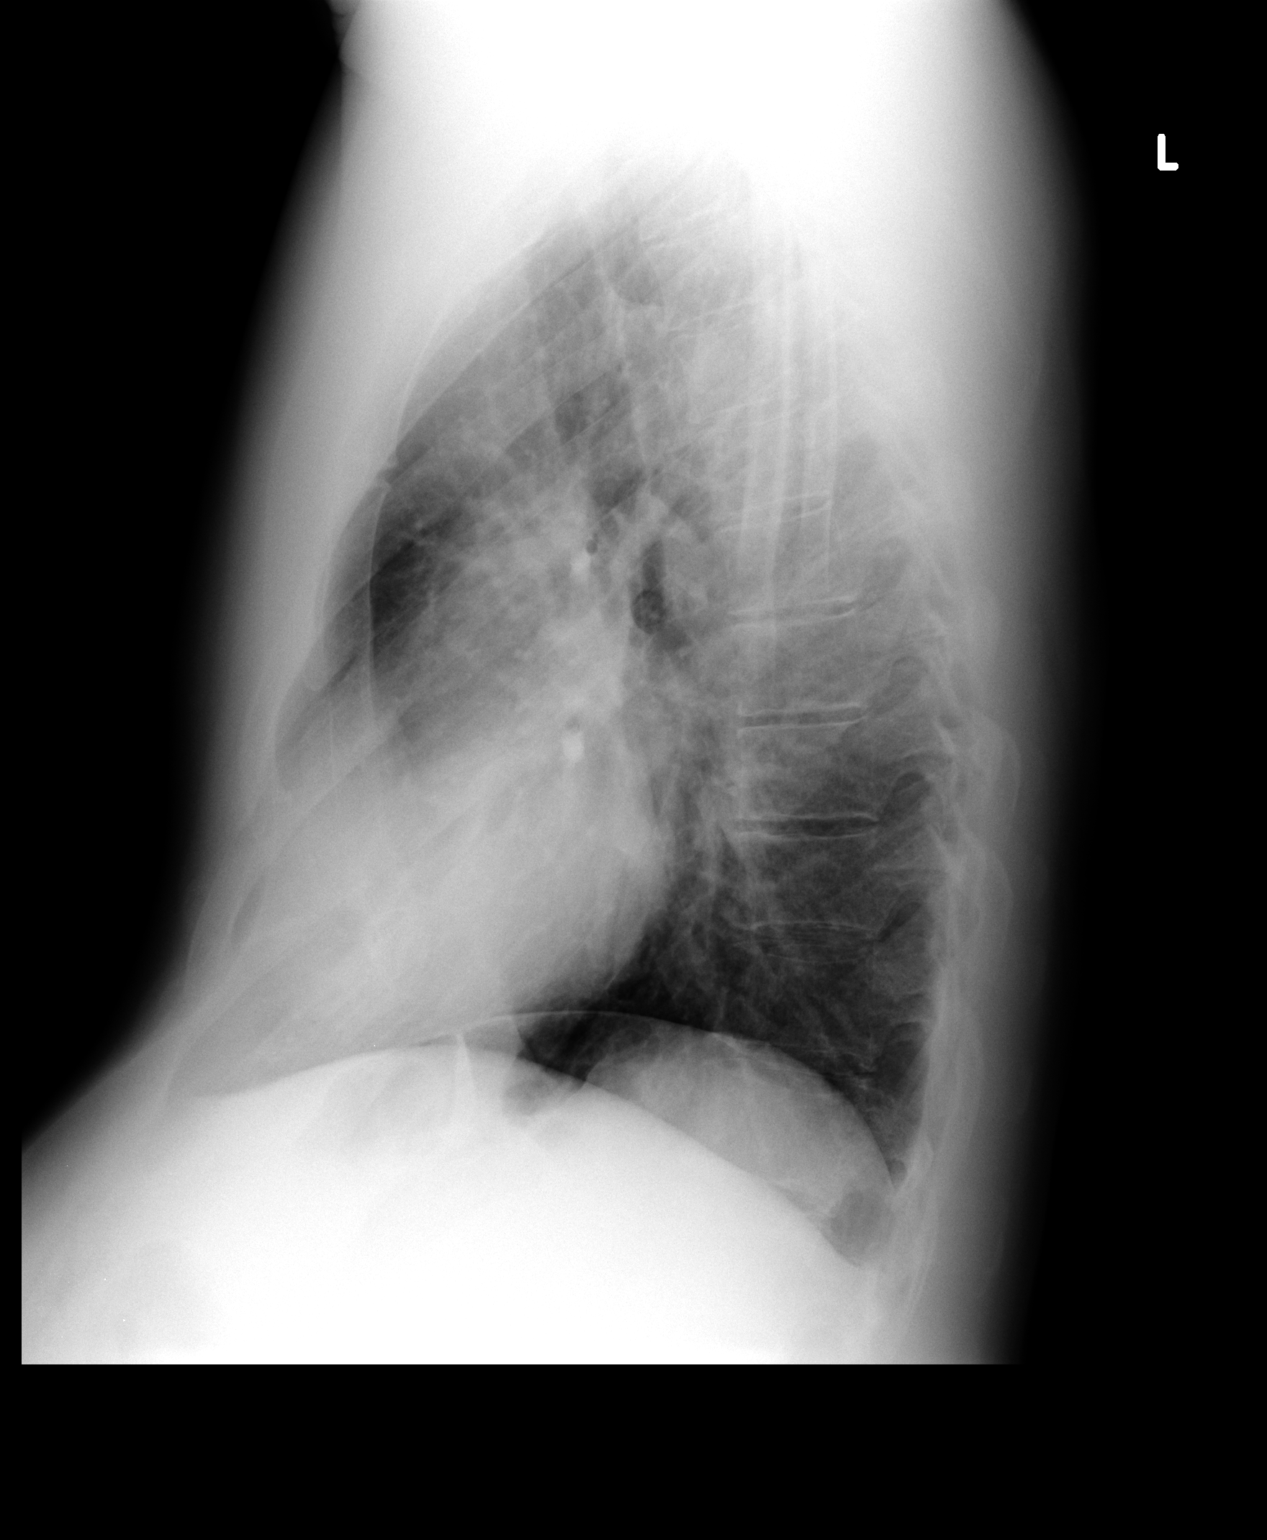

[3 of 3 positions shown; findings below may reference images not displayed]

FINDINGS: Lungs are clear. Heart size and pulmonary vascularity are normal. No
adenopathy. There is pectus excavatum.
IMPRESSION: No edema or consolidation.  Pectus excavatum.

## 2017-01-01 ENCOUNTER — Emergency Department (HOSPITAL_COMMUNITY)
Admission: EM | Admit: 2017-01-01 | Discharge: 2017-01-01 | Disposition: A | Discharge status: Home / Self Care | Payer: BLUE CROSS/BLUE SHIELD | Attending: Physician Assistant | Admitting: Physician Assistant

## 2017-01-01 ENCOUNTER — Emergency Department (HOSPITAL_COMMUNITY): Payer: BLUE CROSS/BLUE SHIELD

## 2017-01-01 ENCOUNTER — Encounter (HOSPITAL_COMMUNITY): Payer: Self-pay

## 2017-01-01 DIAGNOSIS — S80812A Abrasion, left lower leg, initial encounter: Secondary | ICD-10-CM | POA: Diagnosis not present

## 2017-01-01 DIAGNOSIS — Y92410 Unspecified street and highway as the place of occurrence of the external cause: Secondary | ICD-10-CM | POA: Insufficient documentation

## 2017-01-01 DIAGNOSIS — N486 Induration penis plastica: Secondary | ICD-10-CM | POA: Insufficient documentation

## 2017-01-01 DIAGNOSIS — Y998 Other external cause status: Secondary | ICD-10-CM | POA: Insufficient documentation

## 2017-01-01 DIAGNOSIS — Z87891 Personal history of nicotine dependence: Secondary | ICD-10-CM | POA: Diagnosis not present

## 2017-01-01 DIAGNOSIS — M25572 Pain in left ankle and joints of left foot: Secondary | ICD-10-CM | POA: Insufficient documentation

## 2017-01-01 DIAGNOSIS — T148XXA Other injury of unspecified body region, initial encounter: Secondary | ICD-10-CM

## 2017-01-01 DIAGNOSIS — Z85828 Personal history of other malignant neoplasm of skin: Secondary | ICD-10-CM | POA: Insufficient documentation

## 2017-01-01 DIAGNOSIS — M79604 Pain in right leg: Secondary | ICD-10-CM | POA: Diagnosis present

## 2017-01-01 DIAGNOSIS — Z79899 Other long term (current) drug therapy: Secondary | ICD-10-CM | POA: Insufficient documentation

## 2017-01-01 DIAGNOSIS — Y9389 Activity, other specified: Secondary | ICD-10-CM | POA: Diagnosis not present
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
## 2017-01-01 NOTE — ED Notes (Signed)
Ortho tech in. 
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
## 2017-01-01 NOTE — Progress Notes (Signed)
Orthopedic Tech Progress Note Patient Details:  Juan Bender [birth year] [id number]  Ortho Devices Type of Ortho Device: ASO Ortho Device/Splint Location: lle Ortho Device/Splint Interventions: Application   Juan Bender 01/01/2017, 12:15 PM
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
## 2017-03-04 DIAGNOSIS — D485 Neoplasm of uncertain behavior of skin: Secondary | ICD-10-CM | POA: Diagnosis not present

## 2017-03-04 DIAGNOSIS — L814 Other melanin hyperpigmentation: Secondary | ICD-10-CM | POA: Diagnosis not present

## 2017-03-04 DIAGNOSIS — D2270 Melanocytic nevi of unspecified lower limb, including hip: Secondary | ICD-10-CM | POA: Diagnosis not present

## 2017-03-04 DIAGNOSIS — L570 Actinic keratosis: Secondary | ICD-10-CM | POA: Diagnosis not present

## 2017-03-04 DIAGNOSIS — L821 Other seborrheic keratosis: Secondary | ICD-10-CM | POA: Diagnosis not present

## 2017-03-04 DIAGNOSIS — C4491 Basal cell carcinoma of skin, unspecified: Secondary | ICD-10-CM | POA: Diagnosis not present

## 2017-03-15 DIAGNOSIS — C44519 Basal cell carcinoma of skin of other part of trunk: Secondary | ICD-10-CM | POA: Diagnosis not present

## 2017-03-15 DIAGNOSIS — C4441 Basal cell carcinoma of skin of scalp and neck: Secondary | ICD-10-CM | POA: Diagnosis not present

## 2017-03-15 DIAGNOSIS — C44712 Basal cell carcinoma of skin of right lower limb, including hip: Secondary | ICD-10-CM | POA: Diagnosis not present

## 2017-05-17 DIAGNOSIS — M7551 Bursitis of right shoulder: Secondary | ICD-10-CM | POA: Diagnosis not present

## 2017-05-17 DIAGNOSIS — M25511 Pain in right shoulder: Secondary | ICD-10-CM | POA: Diagnosis not present

## 2017-05-19 DIAGNOSIS — M7551 Bursitis of right shoulder: Secondary | ICD-10-CM | POA: Insufficient documentation

## 2017-08-27 DIAGNOSIS — M7711 Lateral epicondylitis, right elbow: Secondary | ICD-10-CM | POA: Diagnosis not present

## 2017-08-30 DIAGNOSIS — M7711 Lateral epicondylitis, right elbow: Secondary | ICD-10-CM | POA: Insufficient documentation

## 2017-09-13 DIAGNOSIS — L814 Other melanin hyperpigmentation: Secondary | ICD-10-CM | POA: Diagnosis not present

## 2017-09-13 DIAGNOSIS — D2270 Melanocytic nevi of unspecified lower limb, including hip: Secondary | ICD-10-CM | POA: Diagnosis not present

## 2017-09-13 DIAGNOSIS — D1801 Hemangioma of skin and subcutaneous tissue: Secondary | ICD-10-CM | POA: Diagnosis not present

## 2017-09-13 DIAGNOSIS — L821 Other seborrheic keratosis: Secondary | ICD-10-CM | POA: Diagnosis not present

## 2017-11-19 DIAGNOSIS — M47817 Spondylosis without myelopathy or radiculopathy, lumbosacral region: Secondary | ICD-10-CM | POA: Diagnosis not present

## 2017-11-19 DIAGNOSIS — M791 Myalgia, unspecified site: Secondary | ICD-10-CM | POA: Diagnosis not present

## 2017-11-19 DIAGNOSIS — M629 Disorder of muscle, unspecified: Secondary | ICD-10-CM | POA: Diagnosis not present

## 2017-11-19 DIAGNOSIS — M545 Low back pain: Secondary | ICD-10-CM | POA: Diagnosis not present

## 2017-12-13 DIAGNOSIS — G501 Atypical facial pain: Secondary | ICD-10-CM | POA: Diagnosis not present

## 2017-12-13 DIAGNOSIS — T148XXA Other injury of unspecified body region, initial encounter: Secondary | ICD-10-CM | POA: Diagnosis not present

## 2017-12-13 DIAGNOSIS — S0992XA Unspecified injury of nose, initial encounter: Secondary | ICD-10-CM | POA: Diagnosis not present

## 2017-12-13 DIAGNOSIS — Z23 Encounter for immunization: Secondary | ICD-10-CM | POA: Diagnosis not present

## 2018-01-14 DIAGNOSIS — E291 Testicular hypofunction: Secondary | ICD-10-CM | POA: Diagnosis not present

## 2018-01-15 DIAGNOSIS — M2550 Pain in unspecified joint: Secondary | ICD-10-CM | POA: Diagnosis not present

## 2018-01-15 DIAGNOSIS — R5383 Other fatigue: Secondary | ICD-10-CM | POA: Diagnosis not present

## 2018-01-15 DIAGNOSIS — E291 Testicular hypofunction: Secondary | ICD-10-CM | POA: Diagnosis not present

## 2018-01-15 DIAGNOSIS — G479 Sleep disorder, unspecified: Secondary | ICD-10-CM | POA: Diagnosis not present

## 2018-01-22 DIAGNOSIS — E291 Testicular hypofunction: Secondary | ICD-10-CM | POA: Diagnosis not present

## 2018-01-29 DIAGNOSIS — E291 Testicular hypofunction: Secondary | ICD-10-CM | POA: Diagnosis not present

## 2018-01-31 DIAGNOSIS — R5383 Other fatigue: Secondary | ICD-10-CM | POA: Diagnosis not present

## 2018-01-31 DIAGNOSIS — E291 Testicular hypofunction: Secondary | ICD-10-CM | POA: Diagnosis not present

## 2018-01-31 DIAGNOSIS — G479 Sleep disorder, unspecified: Secondary | ICD-10-CM | POA: Diagnosis not present

## 2018-01-31 DIAGNOSIS — R6882 Decreased libido: Secondary | ICD-10-CM | POA: Diagnosis not present

## 2018-02-19 DIAGNOSIS — G479 Sleep disorder, unspecified: Secondary | ICD-10-CM | POA: Diagnosis not present

## 2018-02-19 DIAGNOSIS — E291 Testicular hypofunction: Secondary | ICD-10-CM | POA: Diagnosis not present

## 2018-02-19 DIAGNOSIS — R6882 Decreased libido: Secondary | ICD-10-CM | POA: Diagnosis not present

## 2018-02-19 DIAGNOSIS — R5383 Other fatigue: Secondary | ICD-10-CM | POA: Diagnosis not present

## 2018-03-03 DIAGNOSIS — L814 Other melanin hyperpigmentation: Secondary | ICD-10-CM | POA: Diagnosis not present

## 2018-03-03 DIAGNOSIS — D2260 Melanocytic nevi of unspecified upper limb, including shoulder: Secondary | ICD-10-CM | POA: Diagnosis not present

## 2018-03-03 DIAGNOSIS — I781 Nevus, non-neoplastic: Secondary | ICD-10-CM | POA: Diagnosis not present

## 2018-03-03 DIAGNOSIS — D225 Melanocytic nevi of trunk: Secondary | ICD-10-CM | POA: Diagnosis not present

## 2018-03-03 DIAGNOSIS — L739 Follicular disorder, unspecified: Secondary | ICD-10-CM | POA: Diagnosis not present

## 2018-03-03 DIAGNOSIS — D2270 Melanocytic nevi of unspecified lower limb, including hip: Secondary | ICD-10-CM | POA: Diagnosis not present

## 2018-03-03 DIAGNOSIS — D485 Neoplasm of uncertain behavior of skin: Secondary | ICD-10-CM | POA: Diagnosis not present

## 2018-04-22 ENCOUNTER — Telehealth: Payer: Self-pay | Admitting: Family Medicine

## 2018-04-22 NOTE — Telephone Encounter (Signed)
Please schedule patient TOC appointment to Dr. Yong Channel in next available slot. Approval is not needed from previous provider due to retiring.

## 2018-04-22 NOTE — Telephone Encounter (Signed)
Please advise if TOC can be approved due to the circumstances.   Copied from [place] [id number]. Topic: Appointment Scheduling - Scheduling Inquiry for Clinic >> Apr 22, 2018 12:11 PM Leward Quan A wrote: Reason for CRM: Patient was formerly seen Dr Sherren Mocha and stated that he saw Dr Yong Channel in the past and would like to transfer to [facility] to see Dr Yong Channel. But per one note he does not meet the age requirement. I did explain this to the patient but he was not satisfied with that. He asked why cant he just be transferred care to Dr Yong Channel since he seen him before. Please advise, patient awaiting a call back. Ph# [phone]

## 2018-04-22 NOTE — Telephone Encounter (Signed)
Willing to accept?

## 2018-05-26 DIAGNOSIS — M7711 Lateral epicondylitis, right elbow: Secondary | ICD-10-CM | POA: Diagnosis not present

## 2018-06-06 ENCOUNTER — Ambulatory Visit: Payer: BLUE CROSS/BLUE SHIELD | Admitting: Family Medicine

## 2018-06-06 ENCOUNTER — Encounter: Payer: Self-pay | Admitting: Family Medicine

## 2018-06-06 VITALS — BP 125/82 | HR 68 | Temp 97.5°F | Ht 74.0 in | Wt 203.2 lb

## 2018-06-06 DIAGNOSIS — R072 Precordial pain: Secondary | ICD-10-CM | POA: Diagnosis not present

## 2018-06-06 DIAGNOSIS — Z1211 Encounter for screening for malignant neoplasm of colon: Secondary | ICD-10-CM

## 2018-06-06 DIAGNOSIS — R6882 Decreased libido: Secondary | ICD-10-CM

## 2018-06-06 DIAGNOSIS — Z85828 Personal history of other malignant neoplasm of skin: Secondary | ICD-10-CM | POA: Diagnosis not present

## 2018-06-06 DIAGNOSIS — R0683 Snoring: Secondary | ICD-10-CM

## 2018-06-06 DIAGNOSIS — Z0000 Encounter for general adult medical examination without abnormal findings: Secondary | ICD-10-CM | POA: Diagnosis not present

## 2018-06-06 DIAGNOSIS — Z23 Encounter for immunization: Secondary | ICD-10-CM | POA: Diagnosis not present

## 2018-06-06 DIAGNOSIS — E785 Hyperlipidemia, unspecified: Secondary | ICD-10-CM

## 2018-06-06 NOTE — Addendum Note (Signed)
Addended by: Lucianne Lei M on: 06/06/2018 10:46 AM   Modules accepted: Orders

## 2018-06-06 NOTE — Patient Instructions (Addendum)
Health Maintenance Due  Topic Date Due  . COLONOSCOPY -We will call you within two weeks about your referral to GI. If you do not hear within 3 weeks, give [place] a call.   01/30/2018   Shingrix #1 today. Repeat injection in 2-5 months. Schedule a nurse visit for the 2nd injection before you leave today (at the check out desk)  Schedule a lab visit at the check out desk within 2 weeks for between 8 and 9 am. Return for future fasting labs meaning nothing but water after midnight please. Ok to take your medications with water.

## 2018-06-06 NOTE — Progress Notes (Signed)
Phone: [phone]  Subjective:  Patient presents today for their annual physical.  Patient also transferring care to me from Dr. Diego Cory.  Chief complaint-noted.   See problem oriented charting- ROS- full  review of systems was completed and negative (other than low libido and growth on left wrist)  including No chest pain or shortness of breath. No headache or blurry vision.   The following were reviewed and entered/updated in epic: Past Medical History:  Diagnosis Date  . History of cardiac catheterization    a. LHC 8/16:  no CAD, EF 55-65%, Normal caliber aortic root with no evidence of dissection  . History of echocardiogram    Echo 8/16: Mild LVH, vigorous LVF, EF 65-70%, normal wall motion, normal diastolic function, normal RV function, trivial AI, trivial PI, trivial TR  . History of nuclear stress test    a. Myoview 8/16:  Nuclear stress EF: 63%.  Low risk study with no prior infarct or ischemia.   Marland Kitchen History of skin cancer    melanoma removed x3  . Molluscum contagiosum 12/30/2013  . Pectus excavatum   . Peyronie's disease   . RECTAL FISSURE 02/27/2010   Qualifier: Diagnosis of  By: Sherren Mocha MD, Jory Ee    Patient Active Problem List   Diagnosis Date Noted  . History of melanoma. [facility]  02/27/2010    Priority: Medium  . Precordial pain 2016- cause never discovered after intense swim/snorkeling. no recurrence      Priority: Low  . Snoring-  no cpap. occurs when sleeps on back 09/10/2013    Priority: Low  . Peyronie's disease - s/p surgery. still bothered by shortening/scar tissue  12/21/2011    Priority: Low   Past Surgical History:  Procedure Laterality Date  . CARDIAC CATHETERIZATION N/A 12/30/2014   Procedure: Left Heart Cath and Coronary Angiography;  Surgeon: Burnell Blanks, MD;  Location: [facility];  Service: Cardiovascular;  Laterality: N/A;  . EYE SURGERY     lasik  . MELANOMA EXCISION  2011   x3  . NESBIT PROCEDURE  12/21/2011   Peyronie related. Procedure: NESBIT PROCEDURE;  Surgeon: Claybon Jabs, MD;  Location: [facility];  Service: Urology;  Laterality: N/A;  with 16 dot plication  . VASECTOMY      Family History  Problem Relation Age of Onset  . Heart disease Mother   . Heart disease Father   . Hyperlipidemia Father   . Hypertension Father   . Heart attack Father 61       s/p CABG  . Dementia Father   . Healthy Sister     Medications- reviewed and updated No current outpatient medications on file.   No current facility-administered medications for this visit.     Allergies-reviewed and updated No Known Allergies  Social History   Social History Narrative   Married 27 years in 2020. 3 kids- 2 in law school, 44 at [place], 78 daughter at [place] in 2020.       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       Hobbies: home improvement, work in yard, travel    Objective: BP 125/82 (BP Location: Left Arm, Patient Position: Sitting, Cuff Size: Large)   Pulse 68   Temp (!) 97.5 F (36.4 C) (Oral)   Ht 6\' 2"  (1.88 m)   Wt 203 lb 3.2 oz (92.2 kg)   SpO2 99%   BMI 26.09 kg/m  Gen: NAD, resting comfortably HEENT:  Mucous membranes are moist. Oropharynx normal Neck: no thyromegaly CV: RRR no murmurs rubs or gallops Lungs: CTAB no crackles, wheeze, rhonchi Abdomen: soft/nontender/nondistended/normal bowel sounds. No rebound or guarding.  Ext: no edema Skin: warm, dry Neuro: grossly normal, moves all extremities, PERRLA  Assessment/Plan:  53 y.o. male presenting for annual physical.  Health Maintenance counseling: 1. Anticipatory guidance: Patient counseled regarding regular dental exams -q6 months, eye exams - no major issues - perhaps every other yearly,  avoiding smoking and second hand smoke, limiting alcohol to 2 beverages per day.   2. Risk factor reduction:  Advised patient of need for regular exercise and diet rich and fruits and vegetables  to reduce risk of heart attack and stroke. Exercise- walks 3.5 miles 2-3 days a week, tries to do some push ups- considering getting back into the gym. Diet-reasonably balanced. Per BMI overweight but I believe current weight is healthy as long as continues balanced diet and regular exercise.  Wt Readings from Last 3 Encounters:  06/06/18 203 lb 3.2 oz (92.2 kg)  01/01/17 200 lb (90.7 kg)  04/25/16 193 lb 9.6 oz (87.8 kg)  3. Immunizations/screenings/ancillary studies-declined flu shot.  Discussed Shingrix-opts in today  Immunization History  Administered Date(s) Administered  . Tdap 10/30/2017  4. Prostate cancer screening-   No family history, start at age 70   5. Colon cancer screening - will refer for first colonoscopy 6. Skin cancer screening-[facility]- sees due to history melanoma. advised regular sunscreen use. Denies worrisome, changing, or new skin lesions- other than spot on nose with upcoming Mohs 7.  Former smoker-quit in 2003 cigars with golf. No cigarettes since hS. No regular screening needed - at 67 consider AAA scan   Status of chronic or acute concerns   Blood work-he wants to test testosterone. Low libido. No morning erections.  He had blood work at [facility] and had some injections but did not see much benefit.  We discussed possibly referring to urology or endocrine  Ganglion cyst left wrist- we discussed if not continuing to improve could refer to Dr. Paulla Fore of sports medicine.   Mild hyperlipidemia from prior labs- will update lipid panel as well as ascvd risk. Cath in 2016 without any signs CAD- likely use 12% or higher threshold to consider statin  Has some snoring.  No daytime sleepiness.  Declines sleep apnea testing  1 to 2-year physical reasonable  Lab/Order associations:FASTING  Preventative health care - Plan: CBC, Comprehensive metabolic panel, Testos,Total,Free and SHBG (Male), Lipid panel  Snoring  SKIN CANCER, HX OF. [facility]. hx  Mohs  Precordial pain- cause never discovered after intense swim/snorkeling. no recurrence   Screen for colon cancer - Plan: Ambulatory referral to Gastroenterology  Hyperlipidemia, unspecified hyperlipidemia type - Plan: CBC, Comprehensive metabolic panel, Lipid panel  Low libido - Plan: Testos,Total,Free and SHBG (Male)  Return precautions advised.  Garret Reddish, MD

## 2018-06-09 ENCOUNTER — Other Ambulatory Visit (INDEPENDENT_AMBULATORY_CARE_PROVIDER_SITE_OTHER): Payer: BLUE CROSS/BLUE SHIELD

## 2018-06-09 DIAGNOSIS — E785 Hyperlipidemia, unspecified: Secondary | ICD-10-CM

## 2018-06-09 DIAGNOSIS — R6882 Decreased libido: Secondary | ICD-10-CM

## 2018-06-09 DIAGNOSIS — Z0000 Encounter for general adult medical examination without abnormal findings: Secondary | ICD-10-CM

## 2018-06-09 LAB — CBC
HEMATOCRIT: 43.7 % (ref 39.0–52.0)
HEMOGLOBIN: 14.9 g/dL (ref 13.0–17.0)
MCHC: 34.2 g/dL (ref 30.0–36.0)
MCV: 86.9 fl (ref 78.0–100.0)
Platelets: 186 10*3/uL (ref 150.0–400.0)
RBC: 5.02 Mil/uL (ref 4.22–5.81)
RDW: 13.8 % (ref 11.5–15.5)
WBC: 4.7 10*3/uL (ref 4.0–10.5)

## 2018-06-09 LAB — COMPREHENSIVE METABOLIC PANEL
ALT: 17 U/L (ref 0–53)
AST: 15 U/L (ref 0–37)
Albumin: 4.7 g/dL (ref 3.5–5.2)
Alkaline Phosphatase: 59 U/L (ref 39–117)
BUN: 14 mg/dL (ref 6–23)
CO2: 29 mEq/L (ref 19–32)
Calcium: 9.7 mg/dL (ref 8.4–10.5)
Chloride: 103 mEq/L (ref 96–112)
Creatinine, Ser: 1.12 mg/dL (ref 0.40–1.50)
GFR: 69.3 mL/min (ref >60.00)
Glucose, Bld: 97 mg/dL (ref 70–99)
POTASSIUM: 4.3 meq/L (ref 3.5–5.1)
Sodium: 140 mEq/L (ref 135–145)
Total Bilirubin: 0.7 mg/dL (ref 0.2–1.2)
Total Protein: 7 g/dL (ref 6.0–8.3)

## 2018-06-09 LAB — LIPID PANEL
Cholesterol: 178 mg/dL (ref 0–200)
HDL: 36.2 mg/dL — ABNORMAL LOW (ref >39.00)
LDL Cholesterol: 120 mg/dL — ABNORMAL HIGH (ref 0–99)
NonHDL: 141.45
Total CHOL/HDL Ratio: 5
Triglycerides: 105 mg/dL (ref 0.0–149.0)
VLDL: 21 mg/dL (ref 0.0–40.0)

## 2018-06-12 LAB — TESTOS,TOTAL,FREE AND SHBG (FEMALE)
Free Testosterone: 72.1 pg/mL (ref 35.0–155.0)
Sex Hormone Binding: 30 nmol/L (ref 10–50)
Testosterone, Total, LC-MS-MS: 459 ng/dL (ref 250–1100)

## 2018-07-10 DIAGNOSIS — C441122 Basal cell carcinoma of skin of right lower eyelid, including canthus: Secondary | ICD-10-CM | POA: Diagnosis not present

## 2018-07-16 ENCOUNTER — Encounter: Payer: Self-pay | Admitting: Gastroenterology

## 2018-08-04 ENCOUNTER — Telehealth: Payer: Self-pay | Admitting: *Deleted

## 2018-08-04 NOTE — Telephone Encounter (Signed)
Covid-19 travel screening questions  Have you traveled in the last 14 days? YES If yes where? [place]   Do you now or have you had a fever in the last 14 days? NO   Do you have any respiratory symptoms of shortness of breath or cough now or in the last 14 days? NO   Do you have a medical history of Congestive Heart Failure? NO   Do you have a medical history of lung disease? NO   Do you have any family members or close contacts with diagnosed or suspected Covid-19? NO

## 2018-08-05 ENCOUNTER — Encounter: Payer: Self-pay | Admitting: Gastroenterology

## 2018-08-05 ENCOUNTER — Telehealth: Payer: Self-pay | Admitting: Gastroenterology

## 2018-08-05 ENCOUNTER — Ambulatory Visit (AMBULATORY_SURGERY_CENTER): Payer: Self-pay

## 2018-08-05 ENCOUNTER — Other Ambulatory Visit: Payer: Self-pay

## 2018-08-05 VITALS — Ht 74.0 in | Wt 212.0 lb

## 2018-08-05 DIAGNOSIS — Z1211 Encounter for screening for malignant neoplasm of colon: Secondary | ICD-10-CM

## 2018-08-05 MED ORDER — NA SULFATE-K SULFATE-MG SULF 17.5-3.13-1.6 GM/177ML PO SOLN: 1.0000 | Freq: Once | ORAL | 0 refills | Status: AC

## 2018-08-05 NOTE — Telephone Encounter (Signed)
Patient's prescription for Suprep was sent to [facility] in [place]. Patient was called but no answer. Left message that Suprep was sent to pharmacy.   Riki Sheer, LPN ( [place] )

## 2018-08-05 NOTE — Telephone Encounter (Signed)
Pls send suprep to pt's pharmacy, he stated that he checked with his pharmacy and they had not received ordered. He would like a call when this is taken care of.

## 2018-08-05 NOTE — Progress Notes (Signed)
Denies allergies to eggs or soy products. Denies complication of anesthesia or sedation. Denies use of weight loss medication. Denies use of O2.   Emmi instructions declined.   Patients temperature was 97.8.  Patient states  That he has not traveled internationally and he has not experienced any signs or symptoms of illness.  A 15.00 coupon for Suprep was given to the patient.

## 2018-08-07 ENCOUNTER — Other Ambulatory Visit: Payer: Self-pay

## 2018-08-07 ENCOUNTER — Ambulatory Visit (INDEPENDENT_AMBULATORY_CARE_PROVIDER_SITE_OTHER): Payer: BLUE CROSS/BLUE SHIELD

## 2018-08-07 DIAGNOSIS — Z23 Encounter for immunization: Secondary | ICD-10-CM

## 2018-08-07 NOTE — Progress Notes (Signed)
Per orders of Dr. Yong Channel, injection of Second Shingrix  given by Francella Solian in right deltoid. Patient tolerated injection well. Immunizations have been updated in our chart as well as [facility]

## 2018-08-13 ENCOUNTER — Telehealth: Payer: Self-pay | Admitting: *Deleted

## 2018-08-13 NOTE — Telephone Encounter (Signed)
Spoke with patient.  Informed him that his procedure is cancelled d/t covid 19.  Advised him we would call back to reschedule.

## 2018-08-18 ENCOUNTER — Encounter: Payer: BLUE CROSS/BLUE SHIELD | Admitting: Gastroenterology

## 2018-09-23 ENCOUNTER — Telehealth: Payer: Self-pay | Admitting: *Deleted

## 2018-09-23 NOTE — Telephone Encounter (Signed)
Called pt to RS his colon from March- LM to return call to RS colon with Dr Loletha Carrow at his convenience- pt had a PV 3-17 and should have a Suprep  Kit - ask pt if he has this Lelan Pons PV

## 2018-09-23 NOTE — Telephone Encounter (Signed)
Pt states he does have a Suprep Kit- He did RS his colon for 10-09-18 Thursday  at 3 pm with Dr Loletha Carrow- Mail and My Chart instructions to pt- verified address with pt today    Lelan Pons PV

## 2018-10-07 ENCOUNTER — Telehealth: Payer: Self-pay | Admitting: *Deleted

## 2018-10-07 NOTE — Telephone Encounter (Signed)
Covid-19 travel screening questions  Have you traveled in the last 14 days? no If yes where?  Do you now or have you had a fever in the last 14 days? no  Do you have any respiratory symptoms of shortness of breath or cough now or in the last 14 days? no  Do you have any family members or close contacts with diagnosed or suspected Covid-19? no       

## 2018-10-09 ENCOUNTER — Other Ambulatory Visit: Payer: Self-pay

## 2018-10-09 ENCOUNTER — Encounter: Payer: Self-pay | Admitting: Gastroenterology

## 2018-10-09 ENCOUNTER — Ambulatory Visit (AMBULATORY_SURGERY_CENTER): Payer: BLUE CROSS/BLUE SHIELD | Admitting: Gastroenterology

## 2018-10-09 VITALS — BP 122/79 | HR 61 | Temp 98.4°F | Resp 18 | Ht 74.0 in | Wt 212.0 lb

## 2018-10-09 DIAGNOSIS — Z1211 Encounter for screening for malignant neoplasm of colon: Secondary | ICD-10-CM

## 2018-10-09 DIAGNOSIS — K635 Polyp of colon: Secondary | ICD-10-CM | POA: Diagnosis not present

## 2018-10-09 MED ORDER — SODIUM CHLORIDE 0.9 % IV SOLN: 500.0000 mL | Freq: Once | INTRAVENOUS | Status: DC

## 2018-10-09 NOTE — Progress Notes (Signed)
Called to room to assist during endoscopic procedure.  Patient ID and intended procedure confirmed with present staff. Received instructions for my participation in the procedure from the performing physician.  

## 2018-10-09 NOTE — Op Note (Signed)
[facility] Patient Name: Juan Bender Procedure Date: 10/09/2018 3:19 PM MRN: [id number] Endoscopist: Mallie Mussel L. Loletha Carrow , MD Age: 53 Referring MD:  Date of Birth: [birth year] Gender: Male Account #: [id number] Procedure:                Colonoscopy Indications:              Screening for colorectal malignant neoplasm, This                            is the patient's first colonoscopy Medicines:                Monitored Anesthesia Care Procedure:                Pre-Anesthesia Assessment:                           - Prior to the procedure, a History and Physical                            was performed, and patient medications and                            allergies were reviewed. The patient's tolerance of                            previous anesthesia was also reviewed. The risks                            and benefits of the procedure and the sedation                            options and risks were discussed with the patient.                            All questions were answered, and informed consent                            was obtained. Prior Anticoagulants: The patient has                            taken no previous anticoagulant or antiplatelet                            agents. ASA Grade Assessment: I - A normal, healthy                            patient. After reviewing the risks and benefits,                            the patient was deemed in satisfactory condition to                            undergo the procedure.  After obtaining informed consent, the colonoscope                            was passed under direct vision. Throughout the                            procedure, the patient's blood pressure, pulse, and                            oxygen saturations were monitored continuously. The                            Colonoscope was introduced through the anus and                            advanced to the the cecum, identified by                             appendiceal orifice and ileocecal valve. The                            colonoscopy was performed without difficulty. The                            patient tolerated the procedure well. The quality                            of the bowel preparation was good. The ileocecal                            valve, appendiceal orifice, and rectum were                            photographed. Scope In: 3:31:12 PM Scope Out: 3:45:06 PM Scope Withdrawal Time: 0 hours 11 minutes 40 seconds  Total Procedure Duration: 0 hours 13 minutes 54 seconds  Findings:                 The perianal and digital rectal examinations were                            normal.                           The terminal ileum appeared normal.                           A 4 mm polyp was found in the recto-sigmoid colon.                            The polyp was sessile. The polyp was removed with a                            cold snare. Resection and retrieval were complete. Complications:  No immediate complications. Impression:               - The examined portion of the ileum was normal.                           - One 4 mm polyp at the recto-sigmoid colon,                            removed with a cold snare. Resected and retrieved. Recommendation:           - Patient has a contact number available for                            emergencies. The signs and symptoms of potential                            delayed complications were discussed with the                            patient. Return to normal activities tomorrow.                            Written discharge instructions were provided to the                            patient.                           - Resume previous diet.                           - Continue present medications.                           - Repeat colonoscopy is recommended for                            surveillance. The colonoscopy date will be                             determined after pathology results from today's                            exam become available for review. Bruno Leach L. Loletha Carrow, MD 10/09/2018 3:55:29 PM This report has been signed electronically.

## 2018-10-09 NOTE — Progress Notes (Signed)
Pt's states no medical or surgical changes since previsit or office visit.  Temp by CW Vitals by Hiram Comber

## 2018-10-09 NOTE — Patient Instructions (Signed)
Handout for polyps given.  YOU HAD AN ENDOSCOPIC PROCEDURE TODAY AT [facility]:   Refer to the procedure report that was given to you for any specific questions about what was found during the examination.  If the procedure report does not answer your questions, please call your gastroenterologist to clarify.  If you requested that your care partner not be given the details of your procedure findings, then the procedure report has been included in a sealed envelope for you to review at your convenience later.  YOU SHOULD EXPECT: Some feelings of bloating in the abdomen. Passage of more gas than usual.  Walking can help get rid of the air that was put into your GI tract during the procedure and reduce the bloating. If you had a lower endoscopy (such as a colonoscopy or flexible sigmoidoscopy) you may notice spotting of blood in your stool or on the toilet paper. If you underwent a bowel prep for your procedure, you may not have a normal bowel movement for a few days.  Please Note:  You might notice some irritation and congestion in your nose or some drainage.  This is from the oxygen used during your procedure.  There is no need for concern and it should clear up in a day or so.  SYMPTOMS TO REPORT IMMEDIATELY:   Following lower endoscopy (colonoscopy or flexible sigmoidoscopy):  Excessive amounts of blood in the stool  Significant tenderness or worsening of abdominal pains  Swelling of the abdomen that is new, acute  Fever of 100F or higher   For urgent or emergent issues, a gastroenterologist can be reached at any hour by calling [phone].   DIET:  We do recommend a small meal at first, but then you may proceed to your regular diet.  Drink plenty of fluids but you should avoid alcoholic beverages for 24 hours.  ACTIVITY:  You should plan to take it easy for the rest of today and you should NOT DRIVE or use heavy machinery until tomorrow (because of the sedation  medicines used during the test).    FOLLOW UP: Our staff will call the number listed on your records 48-72 hours following your procedure to check on you and address any questions or concerns that you may have regarding the information given to you following your procedure. If we do not reach you, we will leave a message.  We will attempt to reach you two times.  During this call, we will ask if you have developed any symptoms of COVID 19. If you develop any symptoms (for example fever, flu-like symptoms, shortness of breath, cough etc.) before then, please call [phone].  If any biopsies were taken you will be contacted by phone or by letter within the next 1-3 weeks.  Please call us at [phone] if you have not heard about the biopsies in 3 weeks.    SIGNATURES/CONFIDENTIALITY: You and/or your care partner have signed paperwork which will be entered into your electronic medical record.  These signatures attest to the fact that that the information above on your After Visit Summary has been reviewed and is understood.  Full responsibility of the confidentiality of this discharge information lies with you and/or your care-partner.

## 2018-10-09 NOTE — Progress Notes (Signed)
Report given to PACU, vss 

## 2018-10-11 ENCOUNTER — Telehealth: Payer: Self-pay | Admitting: *Deleted

## 2018-10-11 NOTE — Telephone Encounter (Signed)
No answer for first follow up attempt will call back. SM 

## 2018-10-12 ENCOUNTER — Telehealth: Payer: Self-pay | Admitting: *Deleted

## 2018-10-12 NOTE — Telephone Encounter (Signed)
No answer for second post followup call. Left message to call with questions or concerns. SM

## 2018-10-14 ENCOUNTER — Encounter: Payer: Self-pay | Admitting: Gastroenterology

## 2018-10-23 ENCOUNTER — Ambulatory Visit (INDEPENDENT_AMBULATORY_CARE_PROVIDER_SITE_OTHER): Payer: BC Managed Care – PPO | Admitting: Family Medicine

## 2018-10-23 ENCOUNTER — Encounter: Payer: Self-pay | Admitting: Family Medicine

## 2018-10-23 VITALS — Ht 74.0 in | Wt 212.0 lb

## 2018-10-23 DIAGNOSIS — M79662 Pain in left lower leg: Secondary | ICD-10-CM

## 2018-10-23 NOTE — Patient Instructions (Signed)
There are no preventive care reminders to display for this patient.  Depression screen [facility] 2/9 06/06/2018  Decreased Interest 0  Down, Depressed, Hopeless 0  PHQ - 2 Score 0

## 2018-10-23 NOTE — Progress Notes (Signed)
Phone [phone]   Subjective:  Virtual visit via Video note. Chief complaint: Chief Complaint  Patient presents with  . Leg Pain    Pt c/o 5-6 week left calf pain. Pt stated he was on the tredmill. Pt stated the pain is dull. Pt has tried cold and heat therapy w/o relief     This visit type was conducted due to national recommendations for restrictions regarding the COVID-19 Pandemic (e.g. social distancing).  This format is felt to be most appropriate for this patient at this time balancing risks to patient and risks to population by having him in for in person visit.  No physical exam was performed (except for noted visual exam or audio findings with Telehealth visits).    Our team/I connected with Cooper Render at  8:40 AM EDT by a video enabled telemedicine application (doxy.me or caregility through epic) and verified that I am speaking with the correct person using two identifiers.  Location patient: [place] Location provider: [facility], office Persons participating in the virtual visit:  patient  Our team/I discussed the limitations of evaluation and management by telemedicine and the availability of in person appointments. In light of current covid-19 pandemic, patient also understands that we are trying to protect them by minimizing in office contact if at all possible.  The patient expressed consent for telemedicine visit and agreed to proceed. Patient understands insurance will be billed.   ROS- no fever, chills. No shortness of breath or chest pain.  No redness over calf- did have some bruising which has improved  Past Medical History-  Patient Active Problem List   Diagnosis Date Noted  . History of melanoma. [facility]  02/27/2010    Priority: Medium  . Precordial pain 2016- cause never discovered after intense swim/snorkeling. no recurrence      Priority: Low  . Snoring-  no cpap. occurs when sleeps on back 09/10/2013    Priority: Low  . Peyronie's disease -  s/p surgery. still bothered by shortening/scar tissue  12/21/2011    Priority: Low    Medications- reviewed and updated No current outpatient medications on file.   No current facility-administered medications for this visit.      Objective:  Ht 6\' 2"  (1.88 m)   Wt 212 lb (96.2 kg)   BMI 27.22 kg/m  self reported vitals Gen: NAD, resting comfortably Lungs: nonlabored, normal respiratory rate  Skin: appears dry, no obvious rash    Assessment and Plan   # Left lateral calf pain S: patient states he was doing his workout routine 5-6 weeks ago. Part of that is running on treadmill doing 2 miles and felt sharp pain in left calf in outer portion. Stopped running right away and tried to walk it out- painful for several  Weeks and developed bruise in his calf. Got some better but even simple tasks like walking on the beach it started to have a sharp pain and had sto stop. Since then has gotten somewhat better. Had been walking a few miles at lunch but hasn't been able to do that- has been able to walk some in afternoon but if walks several days in a row feels discomfort there- still seems sore to the touch. Was doing ice and heat early on but hasnt done that in a while.   Able to stand on one foot and do toe raise without difficulty or pain. Pain was more on outer calf.   Not swollen or tender- he can point to spot  of pain.  A/P: 53 year old active male with left lateral calf pain that started while running- wonder about potential for a muscular tear- had some bruising associated with this and has never had that before with smaller strains. He is able to raise onto toe without difficulty. I told him I thought he would benefit from seeing sports medicine doctor who could ultrasound area and assess further as well as get him a plan for getting back to his 2 miles running on treadmill (patient's primary goal). Referral to Dr. Tamala Julian placed today   Started while exercising and only really bothering  him with exertion- I though risk of DVT was low (offered  d dimer due to wife's concern but patient opted to hold off for now and start with sports med evaluation). He will let us know if develops calf pain, shortness of breath, chest pain- once again doubt dvt/PE  Future Appointments  Date Time Provider [place]  06/09/2019  9:20 AM Marin Olp, MD [facility]   Lab/Order associations: Pain of left calf - Plan: Ambulatory referral to Sports Medicine  Return precautions advised.  Garret Reddish, MD

## 2018-10-24 DIAGNOSIS — L814 Other melanin hyperpigmentation: Secondary | ICD-10-CM | POA: Diagnosis not present

## 2018-10-24 DIAGNOSIS — D2270 Melanocytic nevi of unspecified lower limb, including hip: Secondary | ICD-10-CM | POA: Diagnosis not present

## 2018-10-24 DIAGNOSIS — D1801 Hemangioma of skin and subcutaneous tissue: Secondary | ICD-10-CM | POA: Diagnosis not present

## 2018-10-24 DIAGNOSIS — D239 Other benign neoplasm of skin, unspecified: Secondary | ICD-10-CM | POA: Diagnosis not present

## 2018-10-28 ENCOUNTER — Encounter: Payer: Self-pay | Admitting: Family Medicine

## 2018-10-28 ENCOUNTER — Ambulatory Visit (INDEPENDENT_AMBULATORY_CARE_PROVIDER_SITE_OTHER): Payer: BC Managed Care – PPO | Admitting: Family Medicine

## 2018-10-28 ENCOUNTER — Ambulatory Visit: Payer: Self-pay

## 2018-10-28 ENCOUNTER — Other Ambulatory Visit: Payer: Self-pay

## 2018-10-28 VITALS — BP 110/68 | HR 98 | Ht 74.0 in | Wt 211.0 lb

## 2018-10-28 DIAGNOSIS — M79662 Pain in left lower leg: Secondary | ICD-10-CM

## 2018-10-28 MED ORDER — NITROGLYCERIN 0.2 MG/HR TD PT24: MEDICATED_PATCH | TRANSDERMAL | 0 refills | Status: DC

## 2018-10-28 NOTE — Patient Instructions (Addendum)
Good to see you.  Ice 20 minutes 2 times daily. Usually after activity and before bed. Exercises 3 times a week.  Heel lift 1/16 to 1/8th inch Nitroglycerin Protocol   Apply 1/4 nitroglycerin patch to affected area daily.  Change position of patch within the affected area every 24 hours.  You may experience a headache during the first 1-2 weeks of using the patch, these should subside.  If you experience headaches after beginning nitroglycerin patch treatment, you may take your preferred over the counter pain reliever.  Another side effect of the nitroglycerin patch is skin irritation or rash related to patch adhesive.  Please notify our office if you develop more severe headaches or rash, and stop the patch.  Tendon healing with nitroglycerin patch may require 12 to 24 weeks depending on the extent of injury.  Men should not use if taking Viagra, Cialis, or Levitra.   Do not use if you have migraines or rosacea.  Vitamin D 2000 IU daily  See me again in 4-6 weeks

## 2018-10-28 NOTE — Assessment & Plan Note (Signed)
Patient is in more of the left calf pain.  Been going on for 4 weeks.  Initial injury was after being on a treadmill when increasing activity.  Discussed with patient that on ultrasound today did not appear to be more of a muscle strain with scar tissue formation.  Patient did not have what appeared to be a deep venous thrombosis but given the possibility of a Doppler which patient declined today.  Discussed nitroglycerin and warned of potential side effects and will do patching.  Discussed heel lift, compression sleeve, home exercises.  Follow-up again in 4 to 6 weeks.

## 2018-10-28 NOTE — Progress Notes (Signed)
[facility] [place], [place] [address] Phone: [phone] Subjective:   Juan Bender, am serving as a scribe for Dr. Hulan Saas.  I'm seeing this patient by the request  of:  Marin Olp, MD   CC: Left calf pain  URK:[id number]  Juan Bender is a 53 y.o. male coming in with complaint of left lateral calf pain. Patient saw Dr. Yong Channel on 10/23/2018. Was running on treadmill 2 months ago. Ends his workout with a sprint and was doing so when he felt a sharp pain. Has had improvement but feels that he has stopped improving. Does have discomfort with walking. Pain is now dull in nature. Is not using NSAIDs.        Past Medical History:  Diagnosis Date  . Cancer ([place])    Skin cancer, melanoma and basel cell  . History of cardiac catheterization    a. LHC 8/16:  Bender CAD, EF 55-65%, Normal caliber aortic root with Bender evidence of dissection  . History of echocardiogram    Echo 8/16: Mild LVH, vigorous LVF, EF 65-70%, normal wall motion, normal diastolic function, normal RV function, trivial AI, trivial PI, trivial TR  . History of nuclear stress test    a. Myoview 8/16:  Nuclear stress EF: 63%.  Low risk study with Bender prior infarct or ischemia.   Marland Kitchen History of skin cancer    melanoma removed x3  . Molluscum contagiosum 12/30/2013  . Pectus excavatum   . Peyronie's disease   . RECTAL FISSURE 02/27/2010   Qualifier: Diagnosis of  By: Sherren Mocha MD, Jory Ee    Past Surgical History:  Procedure Laterality Date  . CARDIAC CATHETERIZATION N/A 12/30/2014   Procedure: Left Heart Cath and Coronary Angiography;  Surgeon: Burnell Blanks, MD;  Location: [facility];  Service: Cardiovascular;  Laterality: N/A;  . EYE SURGERY     lasik  . MELANOMA EXCISION  2011   x3  . NESBIT PROCEDURE  12/21/2011   Peyronie related. Procedure: NESBIT PROCEDURE;  Surgeon: Claybon Jabs, MD;  Location: [facility];  Service: Urology;   Laterality: N/A;  with 16 dot plication  . VASECTOMY     Social History   Socioeconomic History  . Marital status: Married    Spouse name: Not on file  . Number of children: 3  . Years of education: Not on file  . Highest education level: Not on file  Occupational History  . Occupation: Aeronautical engineer  Social Needs  . Financial resource strain: Not on file  . Food insecurity:    Worry: Not on file    Inability: Not on file  . Transportation needs:    Medical: Not on file    Non-medical: Not on file  Tobacco Use  . Smoking status: Former Smoker    Types: Cigars    Last attempt to quit: 12/17/2001    Years since quitting: 16.8  . Smokeless tobacco: Never Used  . Tobacco comment: occasional cigar smoker when golfing  Substance and Sexual Activity  . Alcohol use: Yes    Alcohol/week: 5.0 standard drinks    Types: 5 Glasses of wine per week  . Drug use: Bender  . Sexual activity: Yes  Lifestyle  . Physical activity:    Days per week: Not on file    Minutes per session: Not on file  . Stress: Not on file  Relationships  . Social connections:  Talks on phone: Not on file    Gets together: Not on file    Attends religious service: Not on file    Active member of club or organization: Not on file    Attends meetings of clubs or organizations: Not on file    Relationship status: Not on file  Other Topics Concern  . Not on file  Social History Narrative   Married 27 years in 2020. 3 kids- 33 in law school, 2 at [employer], 37 daughter at [place] in 2020.       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       Hobbies: home improvement, work in yard, travel   Bender Known Allergies Family History  Problem Relation Age of Onset  . Heart disease Mother   . Heart disease Father   . Hyperlipidemia Father   . Hypertension Father   . Heart attack Father 41       s/p CABG  . Dementia Father   . Colon polyps Father   . Healthy Sister   . Colon  cancer Neg Hx   . Esophageal cancer Neg Hx   . Rectal cancer Neg Hx   . Stomach cancer Neg Hx      Current Outpatient Medications (Cardiovascular):  .  nitroGLYCERIN (NITRO-DUR) 0.2 mg/hr patch, Apply 1/4 of a patch to skin once daily.        Past medical history, social, surgical and family history all reviewed in electronic medical record.  Bender pertanent information unless stated regarding to the chief complaint.   Review of Systems:  Bender headache, visual changes, nausea, vomiting, diarrhea, constipation, dizziness, abdominal pain, skin rash, fevers, chills, night sweats, weight loss, swollen lymph nodes, body aches, joint swelling, muscle aches, chest pain, shortness of breath, mood changes.   Objective  Blood pressure 110/68, pulse 98, height 6\' 2"  (1.88 m), weight 211 lb (95.7 kg), SpO2 (!) 68 %. Systems examined below as of    General: Bender apparent distress alert and oriented x3 mood and affect normal, dressed appropriately.  HEENT: Pupils equal, extraocular movements intact  Respiratory: Patient's speak in full sentences and does not appear short of breath  Cardiovascular: Bender lower extremity edema, non tender, Bender erythema  Skin: Warm dry intact with Bender signs of infection or rash on extremities or on axial skeleton.  Abdomen: Soft nontender  Neuro: Cranial nerves II through XII are intact, neurovascularly intact in all extremities with 2+ DTRs and 2+ pulses.  Lymph: Bender lymphadenopathy of posterior or anterior cervical chain or axillae bilaterally.  Gait normal with good balance and coordination.  MSK:  Non tender with full range of motion and good stability and symmetric strength and tone of shoulders, elbows, wrist, hip, knee bilaterally.  Patient is left calf shows some tenderness to palpation in substance.  Full range of motion the of the knee noted.  Mild tightness in the hamstring noted.  Neurovascular intact distally.  Limited musculoskeletal ultrasound was performed and  interpreted by Lyndal Pulley  Limited ultrasound shows the patient does have what appears to be a strain but Bender true defect of the musculature.  Mild increase in hypoechoic changes and increasing Doppler flow.  [id number]; 15 additional minutes spent for Therapeutic exercises as stated in above notes.  This included exercises focusing on stretching, strengthening, with significant focus on eccentric aspects.   Long term goals include an improvement in range of motion, strength, endurance as well as avoiding reinjury. Patient's  frequency would include in 1-2 times a day, 3-5 times a week for a duration of 6-12 weeks. Ankle strengthening that included:  Basic range of motion exercises to allow proper full motion at ankle Stretching of the lower leg and hamstrings  Theraband exercises for the lower leg - inversion, eversion, dorsiflexion and plantarflexion each to be completed with a theraband Balance exercises to increase proprioception Weight bearing exercises to increase strength and balance  Proper technique shown and discussed handout in great detail with ATC.  All questions were discussed and answered.     Impression and Recommendations:     This case required medical decision making of moderate complexity. The above documentation has been reviewed and is accurate and complete Lyndal Pulley, DO       Note: This dictation was prepared with Dragon dictation along with smaller phrase technology. Any transcriptional errors that result from this process are unintentional.

## 2018-11-19 ENCOUNTER — Encounter: Payer: Self-pay | Admitting: Family Medicine

## 2018-11-19 ENCOUNTER — Other Ambulatory Visit: Payer: Self-pay

## 2018-11-19 ENCOUNTER — Ambulatory Visit: Payer: BC Managed Care – PPO | Admitting: Family Medicine

## 2018-11-19 DIAGNOSIS — M79662 Pain in left lower leg: Secondary | ICD-10-CM | POA: Diagnosis not present

## 2018-11-19 NOTE — Assessment & Plan Note (Signed)
Looks good overall at this time.  Patient will continue the nitroglycerin slowly.  We discussed with him.  Consider compression with exercising.  Able to increase activity as tolerated.  Follow-up with me again 6 weeks if not completely resolved

## 2018-11-19 NOTE — Patient Instructions (Signed)
Exercise after running Calf compression sleeve with running Nitro daily for 2 weeks then 3x a wee for 2 weeks See [place] again in 6 weeks

## 2018-11-19 NOTE — Progress Notes (Signed)
[facility] [address] [place] [address], [place] [address] Phone: [phone] Subjective:   I Juan Bender am serving as a Education administrator for Dr. Hulan Saas. CC: Calf pain follow-up  MBW:[id number]   10/28/2018 Patient is in more of the left calf pain.  Been going on for 4 weeks.  Initial injury was after being on a treadmill when increasing activity.  Discussed with patient that on ultrasound today did not appear to be more of a muscle strain with scar tissue formation.  Patient did not have what appeared to be a deep venous thrombosis but given the possibility of a Doppler which patient declined today.  Discussed nitroglycerin and warned of potential side effects and will do patching.  Discussed heel lift, compression sleeve, home exercises.  Follow-up again in 4 to 6 weeks.  11/19/2018 Juan Bender is a 53 y.o. male coming in with complaint of left calf pain. States that he is doing well today.  Patient was found to have more of a calf strain and no true calf tear.  Patient has been doing the home exercises, wearing compression socks with regular activity bilateral with working out.  He has been doing the heel lift.  States 85% better.  Still aware of it.  Denies any swelling.       Past Medical History:  Diagnosis Date  . Cancer ([place])    Skin cancer, melanoma and basel cell  . History of cardiac catheterization    a. LHC 8/16:  no CAD, EF 55-65%, Normal caliber aortic root with no evidence of dissection  . History of echocardiogram    Echo 8/16: Mild LVH, vigorous LVF, EF 65-70%, normal wall motion, normal diastolic function, normal RV function, trivial AI, trivial PI, trivial TR  . History of nuclear stress test    a. Myoview 8/16:  Nuclear stress EF: 63%.  Low risk study with no prior infarct or ischemia.   Marland Kitchen History of skin cancer    melanoma removed x3  . Molluscum contagiosum 12/30/2013  . Pectus excavatum   . Peyronie's disease   . RECTAL FISSURE 02/27/2010    Qualifier: Diagnosis of  By: Sherren Mocha MD, Jory Ee    Past Surgical History:  Procedure Laterality Date  . CARDIAC CATHETERIZATION N/A 12/30/2014   Procedure: Left Heart Cath and Coronary Angiography;  Surgeon: Burnell Blanks, MD;  Location: [facility];  Service: Cardiovascular;  Laterality: N/A;  . EYE SURGERY     lasik  . MELANOMA EXCISION  2011   x3  . NESBIT PROCEDURE  12/21/2011   Peyronie related. Procedure: NESBIT PROCEDURE;  Surgeon: Claybon Jabs, MD;  Location: [facility];  Service: Urology;  Laterality: N/A;  with 16 dot plication  . VASECTOMY     Social History   Socioeconomic History  . Marital status: Married    Spouse name: Not on file  . Number of children: 3  . Years of education: Not on file  . Highest education level: Not on file  Occupational History  . Occupation: Aeronautical engineer  Social Needs  . Financial resource strain: Not on file  . Food insecurity    Worry: Not on file    Inability: Not on file  . Transportation needs    Medical: Not on file    Non-medical: Not on file  Tobacco Use  . Smoking status: Former Smoker    Types: Cigars    Quit date: 12/17/2001  Years since quitting: 16.9  . Smokeless tobacco: Never Used  . Tobacco comment: occasional cigar smoker when golfing  Substance and Sexual Activity  . Alcohol use: Yes    Alcohol/week: 5.0 standard drinks    Types: 5 Glasses of wine per week  . Drug use: No  . Sexual activity: Yes  Lifestyle  . Physical activity    Days per week: Not on file    Minutes per session: Not on file  . Stress: Not on file  Relationships  . Social Herbalist on phone: Not on file    Gets together: Not on file    Attends religious service: Not on file    Active member of club or organization: Not on file    Attends meetings of clubs or organizations: Not on file    Relationship status: Not on file  Other Topics Concern  . Not on file  Social History  Narrative   Married 27 years in 2020. 3 kids- 66 in law school, 61 at [place], 48 daughter at [place] in 2020.       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       Hobbies: home improvement, work in yard, travel   No Known Allergies Family History  Problem Relation Age of Onset  . Heart disease Mother   . Heart disease Father   . Hyperlipidemia Father   . Hypertension Father   . Heart attack Father 11       s/p CABG  . Dementia Father   . Colon polyps Father   . Healthy Sister   . Colon cancer Neg Hx   . Esophageal cancer Neg Hx   . Rectal cancer Neg Hx   . Stomach cancer Neg Hx      Current Outpatient Medications (Cardiovascular):  .  nitroGLYCERIN (NITRO-DUR) 0.2 mg/hr patch, Apply 1/4 of a patch to skin once daily.        Past medical history, social, surgical and family history all reviewed in electronic medical record.  No pertanent information unless stated regarding to the chief complaint.   Review of Systems:  No headache, visual changes, nausea, vomiting, diarrhea, constipation, dizziness, abdominal pain, skin rash, fevers, chills, night sweats, weight loss, swollen lymph nodes, body aches, joint swellingchest pain, shortness of breath, mood changes.  Positive muscle aches  Objective  Blood pressure 108/70, pulse 70, height 6\' 2"  (1.88 m), weight 209 lb (94.8 kg), SpO2 96 %.     General: No apparent distress alert and oriented x3 mood and affect normal, dressed appropriately.  HEENT: Pupils equal, extraocular movements intact  Respiratory: Patient's speak in full sentences and does not appear short of breath  Cardiovascular: No lower extremity edema, non tender, no erythema  Skin: Warm dry intact with no signs of infection or rash on extremities or on axial skeleton.  Abdomen: Soft nontender  Neuro: Cranial nerves II through XII are intact, neurovascularly intact in all extremities with 2+ DTRs and 2+ pulses.  Lymph: No  lymphadenopathy of posterior or anterior cervical chain or axillae bilaterally.  Gait normal with good balance and coordination.  MSK:  Non tender with full range of motion and good stability and symmetric strength and tone of shoulders, elbows, wrist, hip, knee and ankles bilaterally.    On exam nothing significant at all at this time.  No tenderness in the calf.  Negative Thompson.  Full strength.    Impression and Recommendations:  This case required medical decision making of moderate complexity. The above documentation has been reviewed and is accurate and complete Lyndal Pulley, DO       Note: This dictation was prepared with Dragon dictation along with smaller phrase technology. Any transcriptional errors that result from this process are unintentional.

## 2018-11-25 ENCOUNTER — Ambulatory Visit: Payer: BC Managed Care – PPO | Admitting: Family Medicine

## 2018-12-17 DIAGNOSIS — C441122 Basal cell carcinoma of skin of right lower eyelid, including canthus: Secondary | ICD-10-CM | POA: Diagnosis not present

## 2019-01-06 NOTE — Progress Notes (Signed)
[facility] [place], [place] [address] Phone: [phone] Subjective:   I Juan Bender am serving as a Education administrator for Dr. Hulan Saas.   CC: Left calf pain follow-up  IOE:[id number]   11/19/2018 Exercise after running Calf compression sleeve with running Nitro daily for 2 weeks then 3x a wee for 2 weeks See [place] again in 6 weeks  01/07/2019 Juan Bender is a 53 y.o. male coming in with complaint of left calf pain. States that his calf is doing better but he is not 100%. Still feels pain in the same area as before.  Patient states it is nearly 90% better.  Patient states that she all the next couple days he has some soreness.  Patient denies any weakness in the area.  Patient has had new problem.  Patient does have more of a right elbow pain.  Was seen another provider over the course last 2 years intermittently with no significant improvement.  Certain activities seem to cause a sharp pain does have a dull aching sensation almost all the time.  Can affect daily activities such as working on the computer or even dressing sometimes.  Has tried a counterforce strap with minimal improvement.  Has not been given any true exercises.     Past Medical History:  Diagnosis Date  . Cancer ([place])    Skin cancer, melanoma and basel cell  . History of cardiac catheterization    a. LHC 8/16:  no CAD, EF 55-65%, Normal caliber aortic root with no evidence of dissection  . History of echocardiogram    Echo 8/16: Mild LVH, vigorous LVF, EF 65-70%, normal wall motion, normal diastolic function, normal RV function, trivial AI, trivial PI, trivial TR  . History of nuclear stress test    a. Myoview 8/16:  Nuclear stress EF: 63%.  Low risk study with no prior infarct or ischemia.   Marland Kitchen History of skin cancer    melanoma removed x3  . Molluscum contagiosum 12/30/2013  . Pectus excavatum   . Peyronie's disease   . RECTAL FISSURE 02/27/2010   Qualifier: Diagnosis of   By: Sherren Mocha MD, Jory Ee    Past Surgical History:  Procedure Laterality Date  . CARDIAC CATHETERIZATION N/A 12/30/2014   Procedure: Left Heart Cath and Coronary Angiography;  Surgeon: Burnell Blanks, MD;  Location: [facility];  Service: Cardiovascular;  Laterality: N/A;  . EYE SURGERY     lasik  . MELANOMA EXCISION  2011   x3  . NESBIT PROCEDURE  12/21/2011   Peyronie related. Procedure: NESBIT PROCEDURE;  Surgeon: Claybon Jabs, MD;  Location: [facility];  Service: Urology;  Laterality: N/A;  with 16 dot plication  . VASECTOMY     Social History   Socioeconomic History  . Marital status: Married    Spouse name: Not on file  . Number of children: 3  . Years of education: Not on file  . Highest education level: Not on file  Occupational History  . Occupation: Aeronautical engineer  Social Needs  . Financial resource strain: Not on file  . Food insecurity    Worry: Not on file    Inability: Not on file  . Transportation needs    Medical: Not on file    Non-medical: Not on file  Tobacco Use  . Smoking status: Former Smoker    Types: Cigars    Quit date: 12/17/2001    Years since quitting:  17.0  . Smokeless tobacco: Never Used  . Tobacco comment: occasional cigar smoker when golfing  Substance and Sexual Activity  . Alcohol use: Yes    Alcohol/week: 5.0 standard drinks    Types: 5 Glasses of wine per week  . Drug use: No  . Sexual activity: Yes  Lifestyle  . Physical activity    Days per week: Not on file    Minutes per session: Not on file  . Stress: Not on file  Relationships  . Social Herbalist on phone: Not on file    Gets together: Not on file    Attends religious service: Not on file    Active member of club or organization: Not on file    Attends meetings of clubs or organizations: Not on file    Relationship status: Not on file  Other Topics Concern  . Not on file  Social History Narrative   Married 27 years in  2020. 3 kids- 75 in law school, 26 at [place], 74 daughter at [place] in 2020.       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       Hobbies: home improvement, work in yard, travel   No Known Allergies Family History  Problem Relation Age of Onset  . Heart disease Mother   . Heart disease Father   . Hyperlipidemia Father   . Hypertension Father   . Heart attack Father 51       s/p CABG  . Dementia Father   . Colon polyps Father   . Healthy Sister   . Colon cancer Neg Hx   . Esophageal cancer Neg Hx   . Rectal cancer Neg Hx   . Stomach cancer Neg Hx      Current Outpatient Medications (Cardiovascular):  .  nitroGLYCERIN (NITRO-DUR) 0.2 mg/hr patch, Apply 1/4 of a patch to skin once daily.        Past medical history, social, surgical and family history all reviewed in electronic medical record.  No pertanent information unless stated regarding to the chief complaint.   Review of Systems:  No headache, visual changes, nausea, vomiting, diarrhea, constipation, dizziness, abdominal pain, skin rash, fevers, chills, night sweats, weight loss, swollen lymph nodes, body aches, joint swelling, muscle aches, chest pain, shortness of breath, mood changes.   Objective  Blood pressure 100/64, pulse (!) 58, height 6\' 2"  (1.88 m), weight 212 lb (96.2 kg), SpO2 96 %.    General: No apparent distress alert and oriented x3 mood and affect normal, dressed appropriately.  HEENT: Pupils equal, extraocular movements intact  Respiratory: Patient's speak in full sentences and does not appear short of breath  Cardiovascular: No lower extremity edema, non tender, no erythema  Skin: Warm dry intact with no signs of infection or rash on extremities or on axial skeleton.  Abdomen: Soft nontender  Neuro: Cranial nerves II through XII are intact, neurovascularly intact in all extremities with 2+ DTRs and 2+ pulses.  Lymph: No lymphadenopathy of posterior or anterior  cervical chain or axillae bilaterally.  Gait normal with good balance and coordination.  MSK:  Non tender with full range of motion and good stability and symmetric strength and tone of shoulders, wrist, hip, knee and ankles bilaterally.  Right elbow shows the patient does have severe tenderness to palpation over the lateral epicondylar region with patient having worsening discomfort with resisted extension of the wrist.  Patient has full supination and pronation are  noted.  Good grip strength neurovascular intact proximally as well as distally.    Impression and Recommendations:     This case required medical decision making of moderate complexity. The above documentation has been reviewed and is accurate and complete Lyndal Pulley, DO       Note: This dictation was prepared with Dragon dictation along with smaller phrase technology. Any transcriptional errors that result from this process are unintentional.

## 2019-01-07 ENCOUNTER — Encounter: Payer: Self-pay | Admitting: Family Medicine

## 2019-01-07 ENCOUNTER — Ambulatory Visit: Payer: BC Managed Care – PPO | Admitting: Family Medicine

## 2019-01-07 ENCOUNTER — Other Ambulatory Visit: Payer: Self-pay

## 2019-01-07 DIAGNOSIS — M79662 Pain in left lower leg: Secondary | ICD-10-CM

## 2019-01-07 DIAGNOSIS — M7711 Lateral epicondylitis, right elbow: Secondary | ICD-10-CM | POA: Diagnosis not present

## 2019-01-07 NOTE — Assessment & Plan Note (Signed)
Elbow anatomy was reviewed, and tendinopathy was explained.  Pt. given a home rehab program. Start with isometrics and ROM, then a series of concentric and eccentric exercises should be done starting with no weight, work up to 1 lb, hammer, etc.  Use counterforce strap if working or using hands.  Formal PT would be beneficial. Emphasized stretching an cross-friction massage Emphasized proper palms up lifting biomechanics to unload ECRB  

## 2019-01-07 NOTE — Assessment & Plan Note (Signed)
Patient seems to be doing better but still cannot have full running stride at the moment.  We discussed with patient to try to push through.  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  Patient elected to not try any type of injection at the moment.  Declined formal physical therapy.  Follow-up in 4 to 8 weeks

## 2019-01-07 NOTE — Patient Instructions (Signed)
Good to see you Move nitro to elbow instead Avoid over hand lifting Read over PRP See me again in 5-6 weeks
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
## 2019-02-09 ENCOUNTER — Ambulatory Visit (INDEPENDENT_AMBULATORY_CARE_PROVIDER_SITE_OTHER)
Admission: RE | Admit: 2019-02-09 | Discharge: 2019-02-09 | Disposition: A | Discharge status: Home / Self Care | Payer: BLUE CROSS/BLUE SHIELD | Source: Ambulatory Visit | Attending: Family Medicine | Admitting: Family Medicine

## 2019-02-09 ENCOUNTER — Ambulatory Visit: Payer: Self-pay

## 2019-02-09 ENCOUNTER — Other Ambulatory Visit: Payer: Self-pay | Admitting: Family Medicine

## 2019-02-09 ENCOUNTER — Other Ambulatory Visit (INDEPENDENT_AMBULATORY_CARE_PROVIDER_SITE_OTHER): Payer: BLUE CROSS/BLUE SHIELD

## 2019-02-09 ENCOUNTER — Ambulatory Visit: Payer: BC Managed Care – PPO | Admitting: Family Medicine

## 2019-02-09 ENCOUNTER — Other Ambulatory Visit: Payer: Self-pay

## 2019-02-09 ENCOUNTER — Encounter: Payer: Self-pay | Admitting: Family Medicine

## 2019-02-09 VITALS — BP 136/92 | HR 53 | Ht 74.0 in | Wt 214.0 lb

## 2019-02-09 DIAGNOSIS — M7711 Lateral epicondylitis, right elbow: Secondary | ICD-10-CM | POA: Diagnosis not present

## 2019-02-09 DIAGNOSIS — M25521 Pain in right elbow: Secondary | ICD-10-CM

## 2019-02-09 DIAGNOSIS — M25551 Pain in right hip: Secondary | ICD-10-CM | POA: Diagnosis not present

## 2019-02-09 DIAGNOSIS — M2550 Pain in unspecified joint: Secondary | ICD-10-CM

## 2019-02-09 LAB — CBC WITH DIFFERENTIAL/PLATELET
Basophils Absolute: 0 10*3/uL (ref 0.0–0.1)
Basophils Relative: 0.9 % (ref 0.0–3.0)
Eosinophils Absolute: 0.1 10*3/uL (ref 0.0–0.7)
Eosinophils Relative: 1.9 % (ref 0.0–5.0)
HCT: 43.9 % (ref 39.0–52.0)
Hemoglobin: 14.7 g/dL (ref 13.0–17.0)
Lymphocytes Relative: 30.7 % (ref 12.0–46.0)
Lymphs Abs: 1.6 10*3/uL (ref 0.7–4.0)
MCHC: 33.4 g/dL (ref 30.0–36.0)
MCV: 88.8 fl (ref 78.0–100.0)
Monocytes Absolute: 0.3 10*3/uL (ref 0.1–1.0)
Monocytes Relative: 6.1 % (ref 3.0–12.0)
Neutro Abs: 3.2 10*3/uL (ref 1.4–7.7)
Neutrophils Relative %: 60.4 % (ref 43.0–77.0)
Platelets: 195 10*3/uL (ref 150.0–400.0)
RBC: 4.94 Mil/uL (ref 4.22–5.81)
RDW: 13.1 % (ref 11.5–15.5)
WBC: 5.2 10*3/uL (ref 4.0–10.5)

## 2019-02-09 LAB — COMPREHENSIVE METABOLIC PANEL
ALT: 24 U/L (ref 0–53)
AST: 19 U/L (ref 0–37)
Albumin: 4.7 g/dL (ref 3.5–5.2)
Alkaline Phosphatase: 57 U/L (ref 39–117)
BUN: 18 mg/dL (ref 6–23)
CO2: 24 mEq/L (ref 19–32)
Calcium: 10 mg/dL (ref 8.4–10.5)
Chloride: 104 mEq/L (ref 96–112)
Creatinine, Ser: 0.99 mg/dL (ref 0.40–1.50)
GFR: 79.69 mL/min (ref >60.00)
Glucose, Bld: 92 mg/dL (ref 70–99)
Potassium: 4.1 mEq/L (ref 3.5–5.1)
Sodium: 138 mEq/L (ref 135–145)
Total Bilirubin: 0.5 mg/dL (ref 0.2–1.2)
Total Protein: 7.4 g/dL (ref 6.0–8.3)

## 2019-02-09 LAB — URIC ACID: Uric Acid, Serum: 5.5 mg/dL (ref 4.0–7.8)

## 2019-02-09 LAB — SEDIMENTATION RATE: Sed Rate: 11 mm/hr (ref 0–20)

## 2019-02-09 LAB — IBC PANEL
Iron: 95 ug/dL (ref 42–165)
Saturation Ratios: 24.7 % (ref 20.0–50.0)
Transferrin: 275 mg/dL (ref 212.0–360.0)

## 2019-02-09 LAB — TSH: TSH: 1.23 u[IU]/mL (ref 0.35–4.50)

## 2019-02-09 LAB — FERRITIN: Ferritin: 188.6 ng/mL (ref 22.0–322.0)

## 2019-02-09 LAB — C-REACTIVE PROTEIN: CRP: <1 mg/dL (ref 0.5–20.0)

## 2019-02-09 NOTE — Assessment & Plan Note (Signed)
Right hip pain that seems to be more lateral.  Likely more of a gluteal tendon injury.  Differential includes possible back injury.  Discussed icing regimen and home exercises, which activities of doing which wants to avoid.  Patient is to increase activity as tolerated.  Discussed icing regimen, x-rays pending.  Do not think there is any intra-articular pathology at the moment.  Follow-up again in 4 to 8 weeks

## 2019-02-09 NOTE — Assessment & Plan Note (Signed)
Ultrasound shows mild erythema but may be some mild healing in certain areas.  I believe that this is been going on since significant amount of time.  We discussed with patient about PRP.  Patient will consider this.  We will schedule at another time.  Patient is having multiple tendinopathy's with injuries including the new right hip injury as well as laboratory work-up ordered as well today.  Follow-up again with me for PRP or in the near future

## 2019-02-09 NOTE — Patient Instructions (Addendum)
Good to see you Xray and labs downstairs Exercise 3 times a week

## 2019-02-10 LAB — PTH, INTACT AND CALCIUM
Calcium: 9.8 mg/dL (ref 8.6–10.3)
PTH: 29 pg/mL (ref 14–64)

## 2019-02-12 LAB — ANA: Anti Nuclear Antibody (ANA): POSITIVE — AB

## 2019-02-12 LAB — VITAMIN D 1,25 DIHYDROXY
Vitamin D 1, 25 (OH)2 Total: 41 pg/mL (ref 18–72)
Vitamin D2 1, 25 (OH)2: <8 pg/mL
Vitamin D3 1, 25 (OH)2: 41 pg/mL

## 2019-02-12 LAB — ANGIOTENSIN CONVERTING ENZYME: Angiotensin-Converting Enzyme: 32 U/L (ref 9–67)

## 2019-02-12 LAB — CYCLIC CITRUL PEPTIDE ANTIBODY, IGG: Cyclic Citrullin Peptide Ab: <16 UNITS

## 2019-02-12 LAB — TESTOSTERONE, FREE, TOTAL, SHBG
Sex Hormone Binding: 29 nmol/L (ref 19.3–76.4)
Testosterone, Free: 10.7 pg/mL (ref 7.2–24.0)
Testosterone: 390 ng/dL (ref 264–916)

## 2019-02-12 LAB — ANTI-NUCLEAR AB-TITER (ANA TITER): ANA Titer 1: 1:40 {titer} — ABNORMAL HIGH

## 2019-02-12 LAB — RHEUMATOID FACTOR: Rheumatoid fact SerPl-aCnc: 157 IU/mL — ABNORMAL HIGH (ref <14)

## 2019-02-12 LAB — PTH, INTACT AND CALCIUM
Calcium: 9.7 mg/dL (ref 8.6–10.3)
PTH: 17 pg/mL (ref 14–64)

## 2019-02-12 LAB — CALCIUM, IONIZED: Calcium, Ion: 5 mg/dL (ref 4.8–5.6)

## 2019-02-19 ENCOUNTER — Ambulatory Visit (INDEPENDENT_AMBULATORY_CARE_PROVIDER_SITE_OTHER): Payer: Self-pay | Admitting: Family Medicine

## 2019-02-19 ENCOUNTER — Encounter: Payer: Self-pay | Admitting: Family Medicine

## 2019-02-19 ENCOUNTER — Ambulatory Visit: Payer: Self-pay

## 2019-02-19 ENCOUNTER — Other Ambulatory Visit: Payer: Self-pay | Admitting: *Deleted

## 2019-02-19 ENCOUNTER — Other Ambulatory Visit: Payer: BLUE CROSS/BLUE SHIELD

## 2019-02-19 ENCOUNTER — Other Ambulatory Visit: Payer: Self-pay

## 2019-02-19 VITALS — Ht 74.0 in

## 2019-02-19 DIAGNOSIS — M25521 Pain in right elbow: Secondary | ICD-10-CM

## 2019-02-19 DIAGNOSIS — M7711 Lateral epicondylitis, right elbow: Secondary | ICD-10-CM

## 2019-02-19 NOTE — Assessment & Plan Note (Signed)
Injection given today.  Tolerated the procedure well.  Discussed which activities to do which wants to avoid.  Discussed icing regimen and home exercises.  Follow-up again in 4 to 8 weeks

## 2019-02-19 NOTE — Progress Notes (Signed)
[facility] [place], [place] [address] Phone: [phone] Subjective:    I'm seeing this patient by the request  of:    CC:   [id number]   02/09/2019 Right hip pain that seems to be more lateral.  Likely more of a gluteal tendon injury.  Differential includes possible back injury.  Discussed icing regimen and home exercises, which activities of doing which wants to avoid.  Patient is to increase activity as tolerated.  Discussed icing regimen, x-rays pending.  Do not think there is any intra-articular pathology at the moment.  Follow-up again in 4 to 8 weeks  Ultrasound shows mild erythema but may be some mild healing in certain areas.  I believe that this is been going on since significant amount of time.  We discussed with patient about PRP.  Patient will consider this.  We will schedule at another time.  Patient is having multiple tendinopathy's with injuries including the new right hip injury as well as laboratory work-up ordered as well today.  Follow-up again with me for PRP or in the near future  Update 02/19/2019 Juan Bender is a 53 y.o. male coming in with complaint of right elbow pain. Is here for PRP injection today to hasten healing in that area.     Past Medical History:  Diagnosis Date  . Cancer ([place])    Skin cancer, melanoma and basel cell  . History of cardiac catheterization    a. LHC 8/16:  no CAD, EF 55-65%, Normal caliber aortic root with no evidence of dissection  . History of echocardiogram    Echo 8/16: Mild LVH, vigorous LVF, EF 65-70%, normal wall motion, normal diastolic function, normal RV function, trivial AI, trivial PI, trivial TR  . History of nuclear stress test    a. Myoview 8/16:  Nuclear stress EF: 63%.  Low risk study with no prior infarct or ischemia.   Marland Kitchen History of skin cancer    melanoma removed x3  . Molluscum contagiosum 12/30/2013  . Pectus excavatum   . Peyronie's disease   . RECTAL FISSURE  02/27/2010   Qualifier: Diagnosis of  By: Sherren Mocha MD, Jory Ee    Past Surgical History:  Procedure Laterality Date  . CARDIAC CATHETERIZATION N/A 12/30/2014   Procedure: Left Heart Cath and Coronary Angiography;  Surgeon: Burnell Blanks, MD;  Location: [facility];  Service: Cardiovascular;  Laterality: N/A;  . EYE SURGERY     lasik  . MELANOMA EXCISION  2011   x3  . NESBIT PROCEDURE  12/21/2011   Peyronie related. Procedure: NESBIT PROCEDURE;  Surgeon: Claybon Jabs, MD;  Location: [facility];  Service: Urology;  Laterality: N/A;  with 16 dot plication  . VASECTOMY     Social History   Socioeconomic History  . Marital status: Married    Spouse name: Not on file  . Number of children: 3  . Years of education: Not on file  . Highest education level: Not on file  Occupational History  . Occupation: Aeronautical engineer  Social Needs  . Financial resource strain: Not on file  . Food insecurity    Worry: Not on file    Inability: Not on file  . Transportation needs    Medical: Not on file    Non-medical: Not on file  Tobacco Use  . Smoking status: Former Smoker    Types: Cigars    Quit date: 12/17/2001  Years since quitting: 17.1  . Smokeless tobacco: Never Used  . Tobacco comment: occasional cigar smoker when golfing  Substance and Sexual Activity  . Alcohol use: Yes    Alcohol/week: 5.0 standard drinks    Types: 5 Glasses of wine per week  . Drug use: No  . Sexual activity: Yes  Lifestyle  . Physical activity    Days per week: Not on file    Minutes per session: Not on file  . Stress: Not on file  Relationships  . Social Herbalist on phone: Not on file    Gets together: Not on file    Attends religious service: Not on file    Active member of club or organization: Not on file    Attends meetings of clubs or organizations: Not on file    Relationship status: Not on file  Other Topics Concern  . Not on file  Social  History Narrative   Married 27 years in 2020. 3 kids- 79 in law school, 51 at [place], 19 daughter at [place] in 2020.       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       Hobbies: home improvement, work in yard, travel   No Known Allergies Family History  Problem Relation Age of Onset  . Heart disease Mother   . Heart disease Father   . Hyperlipidemia Father   . Hypertension Father   . Heart attack Father 102       s/p CABG  . Dementia Father   . Colon polyps Father   . Healthy Sister   . Colon cancer Neg Hx   . Esophageal cancer Neg Hx   . Rectal cancer Neg Hx   . Stomach cancer Neg Hx      Current Outpatient Medications (Cardiovascular):  .  nitroGLYCERIN (NITRO-DUR) 0.2 mg/hr patch, Apply 1/4 of a patch to skin once daily.        Past medical history, social, surgical and family history all reviewed in electronic medical record.  No pertanent information unless stated regarding to the chief complaint.   Review of Systems:  No headache, visual changes, nausea, vomiting, diarrhea, constipation, dizziness, abdominal pain, skin rash, fevers, chills, night sweats, weight loss, swollen lymph nodes, body aches, joint swelling, muscle aches, chest pain, shortness of breath, mood changes.   Objective  Height 6\' 2"  (1.88 m).    General: No apparent distress alert and oriented x3 mood and affect normal, dressed appropriately.   Procedure: Real-time Ultrasound Guided Injection of right l extensor and common tendon sheath Device: GE Logiq Q7 Ultrasound guided injection is preferred based studies that show increased duration, increased effect, greater accuracy, decreased procedural pain, increased response rate, and decreased cost with ultrasound guided versus blind injection.  Verbal informed consent obtained.  Time-out conducted.  Noted no overlying erythema, induration, or other signs of local infection.  Skin prepped in a sterile fashion.  Local  anesthesia: Topical Ethyl chloride.  With sterile technique and under real time ultrasound guidance: With a 21-gauge 2 inch needle patient was injected with 0.5 cc of 0.5% Marcaine and then injected with 3 cc of pre-centrifuge PRP into the tendon sheath and the surrounding soft tissue area Completed without difficulty  Pain immediately resolved suggesting accurate placement of the medication.  Advised to call if fevers/chills, erythema, induration, drainage, or persistent bleeding.  Images permanently stored and available for review in the ultrasound unit.  Impression: Technically successful  ultrasound guided injection.   Impression and Recommendations:     This case required medical decision making of moderate complexity. The above documentation has been reviewed and is accurate and complete Lyndal Pulley, DO       Note: This dictation was prepared with Dragon dictation along with smaller phrase technology. Any transcriptional errors that result from this process are unintentional.

## 2019-02-19 NOTE — Patient Instructions (Addendum)
PRP today for elbow See me again in 3 weeks

## 2019-02-23 ENCOUNTER — Encounter: Payer: Self-pay | Admitting: Family Medicine

## 2019-02-25 ENCOUNTER — Other Ambulatory Visit: Payer: Self-pay | Admitting: Family Medicine

## 2019-03-08 NOTE — Progress Notes (Signed)
[facility] [place] [place], [place] [address] Phone: [phone] Subjective:   I Juan Bender am serving as a Education administrator for Dr. Hulan Saas.  I'm seeing this patient by the request  of:    CC: Right elbow pain follow-up  [id number]   02/19/2019 Injection given today.  Tolerated the procedure well.  Discussed which activities to do which wants to avoid.  Discussed icing regimen and home exercises.  Follow-up again in 4 to 8 weeks  03/09/2019 Juan Bender is a 53 y.o. male coming in with complaint of right elbow pain. States his elbow is feeling better. Patient states some minimal discomfort on return but nothing severe.  Patient states no significant swelling.  Patient denies having to use any pain medications at this time.  No side effects to the PRP.     Past Medical History:  Diagnosis Date  . Cancer ([place])    Skin cancer, melanoma and basel cell  . History of cardiac catheterization    a. LHC 8/16:  no CAD, EF 55-65%, Normal caliber aortic root with no evidence of dissection  . History of echocardiogram    Echo 8/16: Mild LVH, vigorous LVF, EF 65-70%, normal wall motion, normal diastolic function, normal RV function, trivial AI, trivial PI, trivial TR  . History of nuclear stress test    a. Myoview 8/16:  Nuclear stress EF: 63%.  Low risk study with no prior infarct or ischemia.   Marland Kitchen History of skin cancer    melanoma removed x3  . Molluscum contagiosum 12/30/2013  . Pectus excavatum   . Peyronie's disease   . RECTAL FISSURE 02/27/2010   Qualifier: Diagnosis of  By: Sherren Mocha MD, Jory Ee    Past Surgical History:  Procedure Laterality Date  . CARDIAC CATHETERIZATION N/A 12/30/2014   Procedure: Left Heart Cath and Coronary Angiography;  Surgeon: Burnell Blanks, MD;  Location: [facility];  Service: Cardiovascular;  Laterality: N/A;  . EYE SURGERY     lasik  . MELANOMA EXCISION  2011   x3  . NESBIT PROCEDURE  12/21/2011   Peyronie related. Procedure: NESBIT PROCEDURE;  Surgeon: Claybon Jabs, MD;  Location: [facility];  Service: Urology;  Laterality: N/A;  with 16 dot plication  . VASECTOMY     Social History   Socioeconomic History  . Marital status: Married    Spouse name: Not on file  . Number of children: 3  . Years of education: Not on file  . Highest education level: Not on file  Occupational History  . Occupation: Aeronautical engineer  Social Needs  . Financial resource strain: Not on file  . Food insecurity    Worry: Not on file    Inability: Not on file  . Transportation needs    Medical: Not on file    Non-medical: Not on file  Tobacco Use  . Smoking status: Former Smoker    Types: Cigars    Quit date: 12/17/2001    Years since quitting: 17.2  . Smokeless tobacco: Never Used  . Tobacco comment: occasional cigar smoker when golfing  Substance and Sexual Activity  . Alcohol use: Yes    Alcohol/week: 5.0 standard drinks    Types: 5 Glasses of wine per week  . Drug use: No  . Sexual activity: Yes  Lifestyle  . Physical activity    Days per week: Not on file    Minutes per session: Not on  file  . Stress: Not on file  Relationships  . Social Herbalist on phone: Not on file    Gets together: Not on file    Attends religious service: Not on file    Active member of club or organization: Not on file    Attends meetings of clubs or organizations: Not on file    Relationship status: Not on file  Other Topics Concern  . Not on file  Social History Narrative   Married 27 years in 2020. 3 kids- 13 in law school, 38 at [place], 97 daughter at [place] in 2020.       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       Hobbies: home improvement, work in yard, travel   No Known Allergies Family History  Problem Relation Age of Onset  . Heart disease Mother   . Heart disease Father   . Hyperlipidemia Father   . Hypertension Father    . Heart attack Father 35       s/p CABG  . Dementia Father   . Colon polyps Father   . Healthy Sister   . Colon cancer Neg Hx   . Esophageal cancer Neg Hx   . Rectal cancer Neg Hx   . Stomach cancer Neg Hx      Current Outpatient Medications (Cardiovascular):  .  nitroGLYCERIN (NITRO-DUR) 0.2 mg/hr patch, Apply 1/4 of a patch to skin once daily.        Past medical history, social, surgical and family history all reviewed in electronic medical record.  No pertanent information unless stated regarding to the chief complaint.   Review of Systems:  No headache, visual changes, nausea, vomiting, diarrhea, constipation, dizziness, abdominal pain, skin rash, fevers, chills, night sweats, weight loss, swollen lymph nodes, body aches, joint swelling, , chest pain, shortness of breath, mood changes.  Positive muscle aches  Objective  Blood pressure 138/80, pulse (!) 58, height 6\' 2"  (1.88 m), weight 215 lb (97.5 kg), SpO2 97 %.    General: No apparent distress alert and oriented x3 mood and affect normal, dressed appropriately.  HEENT: Pupils equal, extraocular movements intact  Respiratory: Patient's speak in full sentences and does not appear short of breath  Cardiovascular: No lower extremity edema, non tender, no erythema  Skin: Warm dry intact with no signs of infection or rash on extremities or on axial skeleton.  Abdomen: Soft nontender  Neuro: Cranial nerves II through XII are intact, neurovascularly intact in all extremities with 2+ DTRs and 2+ pulses.  Lymph: No lymphadenopathy of posterior or anterior cervical chain or axillae bilaterally.  Gait normal with good balance and coordination.  MSK:  Non tender with full range of motion and good stability and symmetric strength and tone of shoulders, wrist, hip, knee and ankles bilaterally.  Right elbow examination of the patient is moderately tender to palpation more over the lateral epicondylar region.  Patient also has  decreased pain with resisted wrist extension.  Good grip strength.  Neurovascularly intact distally  Limited musculoskeletal ultrasound was performed and interpreted by me Lyndal Pulley  Limited ultrasound shows the patient has several healing.  Patient does have some new information noted and significant decrease in hypoechoic changes in area of the tear. Impression: Interval healing    Impression and Recommendations:      The above documentation has been reviewed and is accurate and complete Lyndal Pulley, DO  Note: This dictation was prepared with Dragon dictation along with smaller phrase technology. Any transcriptional errors that result from this process are unintentional.

## 2019-03-09 ENCOUNTER — Encounter: Payer: Self-pay | Admitting: Family Medicine

## 2019-03-09 ENCOUNTER — Other Ambulatory Visit: Payer: Self-pay

## 2019-03-09 ENCOUNTER — Ambulatory Visit (INDEPENDENT_AMBULATORY_CARE_PROVIDER_SITE_OTHER): Payer: BLUE CROSS/BLUE SHIELD | Admitting: Family Medicine

## 2019-03-09 ENCOUNTER — Ambulatory Visit: Payer: Self-pay

## 2019-03-09 VITALS — BP 138/80 | HR 58 | Ht 74.0 in | Wt 215.0 lb

## 2019-03-09 DIAGNOSIS — M25521 Pain in right elbow: Secondary | ICD-10-CM

## 2019-03-09 DIAGNOSIS — M7711 Lateral epicondylitis, right elbow: Secondary | ICD-10-CM

## 2019-03-09 NOTE — Assessment & Plan Note (Signed)
Patient is make significant improvement at this time.  Discussed with rectovesicular returns to void.  Patient is to increase activity as tolerated.  Icing regimen.  Follow-up again in 4 to 8 weeks

## 2019-03-28 ENCOUNTER — Other Ambulatory Visit: Payer: Self-pay | Admitting: Family Medicine

## 2019-04-13 NOTE — Progress Notes (Signed)
[facility] [place], [place] [address] Phone: [phone] Subjective:   I Juan Bender am serving as a Education administrator for Dr. Hulan Saas.  I'm seeing this patient by the request  of:    CC: Right elbow pain follow-up  [id number]   03/09/2019 Patient is make significant improvement at this time.  Discussed with rectovesicular returns to void.  Patient is to increase activity as tolerated.  Icing regimen.  Follow-up again in 4 to 8 weeks  04/14/2019 Juan Bender is a 53 y.o. male coming in with complaint of right elbow pain. Patient states he is doing well. Making some progress.  Patient continues to have some mild discomfort but would state about 80% better at this time.  Has had one episode when he was taking down a suitcase and that seemed to cause more discomfort and pain.   Patient has been wearing a nitroglycerin patch.  Past Medical History:  Diagnosis Date  . Cancer ([place])    Skin cancer, melanoma and basel cell  . History of cardiac catheterization    a. LHC 8/16:  no CAD, EF 55-65%, Normal caliber aortic root with no evidence of dissection  . History of echocardiogram    Echo 8/16: Mild LVH, vigorous LVF, EF 65-70%, normal wall motion, normal diastolic function, normal RV function, trivial AI, trivial PI, trivial TR  . History of nuclear stress test    a. Myoview 8/16:  Nuclear stress EF: 63%.  Low risk study with no prior infarct or ischemia.   Marland Kitchen History of skin cancer    melanoma removed x3  . Molluscum contagiosum 12/30/2013  . Pectus excavatum   . Peyronie's disease   . RECTAL FISSURE 02/27/2010   Qualifier: Diagnosis of  By: Sherren Mocha MD, Jory Ee    Past Surgical History:  Procedure Laterality Date  . CARDIAC CATHETERIZATION N/A 12/30/2014   Procedure: Left Heart Cath and Coronary Angiography;  Surgeon: Burnell Blanks, MD;  Location: [facility];  Service: Cardiovascular;  Laterality: N/A;  . EYE SURGERY      lasik  . MELANOMA EXCISION  2011   x3  . NESBIT PROCEDURE  12/21/2011   Peyronie related. Procedure: NESBIT PROCEDURE;  Surgeon: Claybon Jabs, MD;  Location: [facility];  Service: Urology;  Laterality: N/A;  with 16 dot plication  . VASECTOMY     Social History   Socioeconomic History  . Marital status: Married    Spouse name: Not on file  . Number of children: 3  . Years of education: Not on file  . Highest education level: Not on file  Occupational History  . Occupation: Aeronautical engineer  Social Needs  . Financial resource strain: Not on file  . Food insecurity    Worry: Not on file    Inability: Not on file  . Transportation needs    Medical: Not on file    Non-medical: Not on file  Tobacco Use  . Smoking status: Former Smoker    Types: Cigars    Quit date: 12/17/2001    Years since quitting: 17.3  . Smokeless tobacco: Never Used  . Tobacco comment: occasional cigar smoker when golfing  Substance and Sexual Activity  . Alcohol use: Yes    Alcohol/week: 5.0 standard drinks    Types: 5 Glasses of wine per week  . Drug use: No  . Sexual activity: Yes  Lifestyle  . Physical activity  Days per week: Not on file    Minutes per session: Not on file  . Stress: Not on file  Relationships  . Social Herbalist on phone: Not on file    Gets together: Not on file    Attends religious service: Not on file    Active member of club or organization: Not on file    Attends meetings of clubs or organizations: Not on file    Relationship status: Not on file  Other Topics Concern  . Not on file  Social History Narrative   Married 27 years in 2020. 3 kids- 74 in law school, 75 at [place], 58 daughter at [place] in 2020.       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       Hobbies: home improvement, work in yard, travel   No Known Allergies Family History  Problem Relation Age of Onset  . Heart disease Mother   .  Heart disease Father   . Hyperlipidemia Father   . Hypertension Father   . Heart attack Father 61       s/p CABG  . Dementia Father   . Colon polyps Father   . Healthy Sister   . Colon cancer Neg Hx   . Esophageal cancer Neg Hx   . Rectal cancer Neg Hx   . Stomach cancer Neg Hx      Current Outpatient Medications (Cardiovascular):  .  nitroGLYCERIN (NITRODUR - DOSED IN MG/24 HR) 0.2 mg/hr patch, APPLY 1/4 OF A PATCH TO SKIN ONCE DAILY.        Past medical history, social, surgical and family history all reviewed in electronic medical record.  No pertanent information unless stated regarding to the chief complaint.   Review of Systems:  No headache, visual changes, nausea, vomiting, diarrhea, constipation, dizziness, abdominal pain, skin rash, fevers, chills, night sweats, weight loss, swollen lymph nodes, body aches, joint swelling, , chest pain, shortness of breath, mood changes.  Positive muscle aches  Objective  Blood pressure 100/84, pulse 60, height 6\' 2"  (1.88 m), SpO2 97 %.   General: No apparent distress alert and oriented x3 mood and affect normal, dressed appropriately.  HEENT: Pupils equal, extraocular movements intact  Respiratory: Patient's speak in full sentences and does not appear short of breath  Cardiovascular: No lower extremity edema, non tender, no erythema  Skin: Warm dry intact with no signs of infection or rash on extremities or on axial skeleton.  Abdomen: Soft nontender  Neuro: Cranial nerves II through XII are intact, neurovascularly intact in all extremities with 2+ DTRs and 2+ pulses.  Lymph: No lymphadenopathy of posterior or anterior cervical chain or axillae bilaterally.  Gait normal with good balance and coordination.  MSK:  Non tender with full range of motion and good stability and symmetric strength and tone of shoulders,  wrist, hip, knee and ankles bilaterally.  Elbow: Right Unremarkable to inspection. Range of motion full pronation,  supination, flexion, extension. Strength is full to all of the above directions Stable to varus, valgus stress. Negative moving valgus stress test. Mild pain over the lateral epicondylar region Ulnar nerve does not sublux. Negative cubital tunnel Tinel's.  Musculoskeletal ultrasound was performed and interpreted by Charlann Boxer D.O.   Elbow:  Lateral epicondyle and common extensor tendon origin hypoechoic changes bilaterally noted.  A single improving and decreasing in vascularity in neovascularization. Radial head unremarkable and located in annular ligament Medial epicondyle and common  flexor tendon origin visualized.  No edema, effusions, or avulsions seen. Ulnar nerve in cubital tunnel unremarkable. Olecranon and triceps insertion visualized and unremarkable without edema, effusion, or avulsion.  No signs olecranon bursitis. Power doppler signal normal.  IMPRESSION: Improvement in lateral epicondylitis    Impression and Recommendations:     . The above documentation has been reviewed and is accurate and complete Lyndal Pulley, DO       Note: This dictation was prepared with Dragon dictation along with smaller phrase technology. Any transcriptional errors that result from this process are unintentional.

## 2019-04-14 ENCOUNTER — Encounter: Payer: Self-pay | Admitting: Family Medicine

## 2019-04-14 ENCOUNTER — Ambulatory Visit (INDEPENDENT_AMBULATORY_CARE_PROVIDER_SITE_OTHER): Payer: BLUE CROSS/BLUE SHIELD | Admitting: Family Medicine

## 2019-04-14 DIAGNOSIS — M7711 Lateral epicondylitis, right elbow: Secondary | ICD-10-CM | POA: Diagnosis not present

## 2019-04-14 NOTE — Assessment & Plan Note (Signed)
Patient is doing significantly better now 6 weeks after the PRP.  Seems to have good healing at the moment.  Patient's previous irregularity seem to be better.  Discussed discontinuing the nitroglycerin patches, increasing activity as tolerated.  Follow-up with me again as needed.

## 2019-05-11 ENCOUNTER — Encounter: Payer: Self-pay | Admitting: Family Medicine

## 2019-06-09 ENCOUNTER — Ambulatory Visit (INDEPENDENT_AMBULATORY_CARE_PROVIDER_SITE_OTHER): Payer: BLUE CROSS/BLUE SHIELD | Admitting: Family Medicine

## 2019-06-09 ENCOUNTER — Encounter: Payer: Self-pay | Admitting: Family Medicine

## 2019-06-09 ENCOUNTER — Other Ambulatory Visit: Payer: Self-pay

## 2019-06-09 VITALS — BP 120/82 | HR 61 | Temp 97.3°F | Ht 74.0 in | Wt 211.4 lb

## 2019-06-09 DIAGNOSIS — E785 Hyperlipidemia, unspecified: Secondary | ICD-10-CM

## 2019-06-09 DIAGNOSIS — Z0000 Encounter for general adult medical examination without abnormal findings: Secondary | ICD-10-CM

## 2019-06-09 LAB — LIPID PANEL
Cholesterol: 187 mg/dL (ref 0–200)
HDL: 41 mg/dL (ref >39.00)
LDL Cholesterol: 131 mg/dL — ABNORMAL HIGH (ref 0–99)
NonHDL: 145.88
Total CHOL/HDL Ratio: 5
Triglycerides: 75 mg/dL (ref 0.0–149.0)
VLDL: 15 mg/dL (ref 0.0–40.0)

## 2019-06-09 LAB — CBC WITH DIFFERENTIAL/PLATELET
Basophils Absolute: 0 10*3/uL (ref 0.0–0.1)
Basophils Relative: 0.7 % (ref 0.0–3.0)
Eosinophils Absolute: 0.1 10*3/uL (ref 0.0–0.7)
Eosinophils Relative: 1 % (ref 0.0–5.0)
HCT: 45.9 % (ref 39.0–52.0)
Hemoglobin: 15.1 g/dL (ref 13.0–17.0)
Lymphocytes Relative: 32.1 % (ref 12.0–46.0)
Lymphs Abs: 1.7 10*3/uL (ref 0.7–4.0)
MCHC: 32.9 g/dL (ref 30.0–36.0)
MCV: 88.7 fl (ref 78.0–100.0)
Monocytes Absolute: 0.3 10*3/uL (ref 0.1–1.0)
Monocytes Relative: 6 % (ref 3.0–12.0)
Neutro Abs: 3.1 10*3/uL (ref 1.4–7.7)
Neutrophils Relative %: 60.2 % (ref 43.0–77.0)
Platelets: 213 10*3/uL (ref 150.0–400.0)
RBC: 5.18 Mil/uL (ref 4.22–5.81)
RDW: 13.5 % (ref 11.5–15.5)
WBC: 5.2 10*3/uL (ref 4.0–10.5)

## 2019-06-09 LAB — COMPREHENSIVE METABOLIC PANEL
ALT: 28 U/L (ref 0–53)
AST: 19 U/L (ref 0–37)
Albumin: 4.9 g/dL (ref 3.5–5.2)
Alkaline Phosphatase: 56 U/L (ref 39–117)
BUN: 20 mg/dL (ref 6–23)
CO2: 30 mEq/L (ref 19–32)
Calcium: 10 mg/dL (ref 8.4–10.5)
Chloride: 102 mEq/L (ref 96–112)
Creatinine, Ser: 1.03 mg/dL (ref 0.40–1.50)
GFR: 76.03 mL/min (ref >60.00)
Glucose, Bld: 96 mg/dL (ref 70–99)
Potassium: 4.9 mEq/L (ref 3.5–5.1)
Sodium: 137 mEq/L (ref 135–145)
Total Bilirubin: 0.6 mg/dL (ref 0.2–1.2)
Total Protein: 7.5 g/dL (ref 6.0–8.3)

## 2019-06-09 NOTE — Patient Instructions (Addendum)
Ill try to message you about what our PT Dr. Kayleen Memos says about stretching or if shed advise a visit with her.   Please stop by lab before you go If you do not have mychart- we will call you about results within 5 business days of [place] receiving them.  If you have mychart- we will send your results within 3 business days of [place] receiving them.  If abnormal or we want to clarify a result, we will call or mychart you to make sure you receive the message.  If you have questions or concerns or don't hear within 5-7 days, please send [place] a message or call us.    Recommended follow up: No follow-ups on file.

## 2019-06-09 NOTE — Progress Notes (Signed)
Phone: [phone]   Subjective:  Patient presents today for their annual physical. Chief complaint-noted.   See problem oriented charting- Review of Systems  Constitutional: Negative.   HENT: Negative.   Eyes: Negative.   Respiratory: Negative.   Cardiovascular: Negative.   Gastrointestinal: Negative.   Genitourinary: Negative.   Musculoskeletal: Negative.   Skin: Negative.   Neurological: Negative.   Endo/Heme/Allergies: Negative.   Psychiatric/Behavioral: Negative.    The following were reviewed and entered/updated in epic: Past Medical History:  Diagnosis Date  . Cancer ([place])    Skin cancer, melanoma and basel cell  . History of cardiac catheterization    a. LHC 8/16:  no CAD, EF 55-65%, Normal caliber aortic root with no evidence of dissection  . History of echocardiogram    Echo 8/16: Mild LVH, vigorous LVF, EF 65-70%, normal wall motion, normal diastolic function, normal RV function, trivial AI, trivial PI, trivial TR  . History of nuclear stress test    a. Myoview 8/16:  Nuclear stress EF: 63%.  Low risk study with no prior infarct or ischemia.   Marland Kitchen History of skin cancer    melanoma removed x3  . Molluscum contagiosum 12/30/2013  . Pectus excavatum   . Peyronie's disease   . RECTAL FISSURE 02/27/2010   Qualifier: Diagnosis of  By: Sherren Mocha MD, Jory Ee    Patient Active Problem List   Diagnosis Date Noted  . History of melanoma. [facility]  02/27/2010    Priority: Medium  . Precordial pain 2016- cause never discovered after intense swim/snorkeling. no recurrence      Priority: Low  . Snoring-  no cpap. occurs when sleeps on back 09/10/2013    Priority: Low  . Peyronie's disease - s/p surgery. still bothered by shortening/scar tissue  12/21/2011    Priority: Low  . Right hip pain 02/09/2019  . Right lateral epicondylitis 01/07/2019  . Pain of left calf 10/28/2018   Past Surgical History:  Procedure Laterality Date  . CARDIAC CATHETERIZATION N/A  12/30/2014   Procedure: Left Heart Cath and Coronary Angiography;  Surgeon: Burnell Blanks, MD;  Location: [facility];  Service: Cardiovascular;  Laterality: N/A;  . EYE SURGERY     lasik  . MELANOMA EXCISION  2011   x3  . NESBIT PROCEDURE  12/21/2011   Peyronie related. Procedure: NESBIT PROCEDURE;  Surgeon: Claybon Jabs, MD;  Location: [facility];  Service: Urology;  Laterality: N/A;  with 16 dot plication  . VASECTOMY      Family History  Problem Relation Age of Onset  . Heart disease Mother   . Heart disease Father   . Hyperlipidemia Father   . Hypertension Father   . Heart attack Father 75       s/p CABG  . Dementia Father   . Colon polyps Father   . Healthy Sister   . Colon cancer Neg Hx   . Esophageal cancer Neg Hx   . Rectal cancer Neg Hx   . Stomach cancer Neg Hx     Medications- reviewed and updated Current Outpatient Medications  Medication Sig Dispense Refill  . Ascorbic Acid (VITAMIN C) 500 MG CHEW     . Cholecalciferol (VITAMIN D3 PO)     . Misc Natural Products (TURMERIC CURCUMIN) CAPS     . Zinc 50 MG CAPS      No current facility-administered medications for this visit.    Allergies-reviewed and updated No Known Allergies  Social  History   Social History Narrative   Married 27 years in 2020. 3 kids- 75 finished law school, 11 at [place], 58 daughter at [place] in 2021.       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       Hobbies: home improvement, work in yard, travel   Objective  Objective:  BP 120/82   Pulse 61   Temp (!) 97.3 F (36.3 C) (Temporal)   Ht 6\' 2"  (1.88 m)   Wt 211 lb 6.4 oz (95.9 kg)   SpO2 97%   BMI 27.14 kg/m  Gen: NAD, resting comfortably HEENT: Mask not removed due to covid 19. TM normal. Bridge of nose normal. Eyelids normal.  Neck: no thyromegaly or cervical lymphadenopathy  CV: RRR no murmurs rubs or gallops Lungs: CTAB no crackles, wheeze,  rhonchi Abdomen: soft/nontender/nondistended/normal bowel sounds. No rebound or guarding.  Ext: no edema Skin: warm, dry Neuro: grossly normal, moves all extremities, PERRLA    Assessment and Plan  54 y.o. male presenting for annual physical.  Health Maintenance counseling: 1. Anticipatory guidance: Patient counseled regarding regular dental exams -q6 months, eye exams - every other yearly or so- no recent issues with vision,  avoiding smoking and second hand smoke , limiting alcohol to 2 beverages per day .   2. Risk factor reduction:  Advised patient of need for regular exercise and diet rich and fruits and vegetables to reduce risk of heart attack and stroke. Weight had gone up to 220 so had tried to eat more balanced- has lost 10 lbs- congratulated efforts. Also increased exercise. Target weight 185.  Wt Readings from Last 3 Encounters:  06/09/19 211 lb 6.4 oz (95.9 kg)  03/09/19 215 lb (97.5 kg)  02/09/19 214 lb (97.1 kg)  3. Immunizations/screenings/ancillary studies-declined flu shot.  COVID-19 vaccination discussed- just had infection in November. He is going to think over things when available.   Immunization History  Administered Date(s) Administered  . Tdap 10/30/2017  . Zoster Recombinat (Shingrix) 06/06/2018, 08/07/2018  4. Prostate cancer screening- no family history, start screening age 56  5. Colon cancer screening - 10/09/2018 with 10 year repeat- benign biopsy.  6. Skin cancer screening-history of melanoma follows with [facility].advised regular sunscreen use. Denies worrisome, changing, or new skin lesions.  7. Former smoker-quit in 2003 cigars with golf. No cigarettes since hS. No regular screening needed - at 67 consider AAA scan   Status of chronic or acute concerns   Back in summer hurt calf and then hurt hamstring - flexibility is poor since those injuries. Tries to stretch before- when he does treadmill has had issues with left hamstring and right. Does  stretches for 20 or 30 seconds. Doesn't feel like he is gaining flexibility. Does toe touch efforts, stretches with bands from distal foot, does lunges.  - intermittent issues with hamstrings on both sides but calf been good lately  Mild hyperlipidemia- 10 year risk only 4.1%- remain focused on lifestyle changes  Recommended follow up: 1 year physical  Chief Complaint  Patient presents with  . Annual Exam  . Calf muscle injury    Pt injured calf muscle on treadmill.   Lab/Order associations: fasting   ICD-10-CM   1. Preventative health care  Z00.00 CBC with Differential/Platelet    Comprehensive metabolic panel    Lipid panel  2. Mild hyperlipidemia  E78.5 CBC with Differential/Platelet    Comprehensive metabolic panel    Lipid panel   Return  precautions advised.  Garret Reddish, MD

## 2019-06-09 NOTE — Progress Notes (Deleted)
Phone: [phone]   Subjective:  Patient presents today for their annual physical. Chief complaint-noted.   See problem oriented charting- Review of Systems  Constitutional: Negative.   HENT: Negative.   Eyes: Negative.   Respiratory: Negative.   Cardiovascular: Negative.   Gastrointestinal: Negative.   Genitourinary: Negative.   Musculoskeletal: Negative.   Skin: Negative.   Neurological: Negative.   Endo/Heme/Allergies: Negative.   Psychiatric/Behavioral: Negative.   - full  review of systems was completed and negative  except for: ***  The following were reviewed and entered/updated in epic: Past Medical History:  Diagnosis Date  . Cancer ([place])    Skin cancer, melanoma and basel cell  . History of cardiac catheterization    a. LHC 8/16:  no CAD, EF 55-65%, Normal caliber aortic root with no evidence of dissection  . History of echocardiogram    Echo 8/16: Mild LVH, vigorous LVF, EF 65-70%, normal wall motion, normal diastolic function, normal RV function, trivial AI, trivial PI, trivial TR  . History of nuclear stress test    a. Myoview 8/16:  Nuclear stress EF: 63%.  Low risk study with no prior infarct or ischemia.   Marland Kitchen History of skin cancer    melanoma removed x3  . Molluscum contagiosum 12/30/2013  . Pectus excavatum   . Peyronie's disease   . RECTAL FISSURE 02/27/2010   Qualifier: Diagnosis of  By: Sherren Mocha MD, Jory Ee    Patient Active Problem List   Diagnosis Date Noted  . Right hip pain 02/09/2019  . Right lateral epicondylitis 01/07/2019  . Pain of left calf 10/28/2018  . Precordial pain 2016- cause never discovered after intense swim/snorkeling. no recurrence    . Snoring-  no cpap. occurs when sleeps on back 09/10/2013  . Peyronie's disease - s/p surgery. still bothered by shortening/scar tissue  12/21/2011  . History of melanoma. [facility]  02/27/2010   Past Surgical History:  Procedure Laterality Date  . CARDIAC CATHETERIZATION N/A  12/30/2014   Procedure: Left Heart Cath and Coronary Angiography;  Surgeon: Burnell Blanks, MD;  Location: [facility];  Service: Cardiovascular;  Laterality: N/A;  . EYE SURGERY     lasik  . MELANOMA EXCISION  2011   x3  . NESBIT PROCEDURE  12/21/2011   Peyronie related. Procedure: NESBIT PROCEDURE;  Surgeon: Claybon Jabs, MD;  Location: [facility];  Service: Urology;  Laterality: N/A;  with 16 dot plication  . VASECTOMY      Family History  Problem Relation Age of Onset  . Heart disease Mother   . Heart disease Father   . Hyperlipidemia Father   . Hypertension Father   . Heart attack Father 6       s/p CABG  . Dementia Father   . Colon polyps Father   . Healthy Sister   . Colon cancer Neg Hx   . Esophageal cancer Neg Hx   . Rectal cancer Neg Hx   . Stomach cancer Neg Hx     Medications- reviewed and updated Current Outpatient Medications  Medication Sig Dispense Refill  . nitroGLYCERIN (NITRODUR - DOSED IN MG/24 HR) 0.2 mg/hr patch APPLY 1/4 OF A PATCH TO SKIN ONCE DAILY. 18 patch 1   No current facility-administered medications for this visit.    Allergies-reviewed and updated No Known Allergies  Social History   Social History Narrative   Married 27 years in 2020. 3 kids- 60 in [employer] school, 70 at [place], [place]  daughter at [place] in 2020.       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       Hobbies: home improvement, work in yard, travel   Objective  Objective:  There were no vitals taken for this visit. Gen: NAD, resting comfortably HEENT: Mucous membranes are moist. Oropharynx normal Neck: no thyromegaly CV: RRR no murmurs rubs or gallops Lungs: CTAB no crackles, wheeze, rhonchi Abdomen: soft/nontender/nondistended/normal bowel sounds. No rebound or guarding.  Ext: no edema Skin: warm, dry Neuro: grossly normal, moves all extremities, PERRLA ***   Assessment and Plan  54 y.o. male presenting for  annual physical.  Health Maintenance counseling: 1. Anticipatory guidance: Patient counseled regarding regular dental exams q6 months,eye exams yearly,  avoiding smoking and second hand smoke , limiting alcohol to 10 beverages per week.   2. Risk factor reduction:  Advised patient of need for regular exercise and diet rich and fruits and vegetables to reduce risk of heart attack and stroke. Exercise- Patient does yoga and treadmill activities.  Diet- Patient has a balanced diet. Wt Readings from Last 3 Encounters:  03/09/19 215 lb (97.5 kg)  02/09/19 214 lb (97.1 kg)  01/07/19 212 lb (96.2 kg)   3. Immunizations/screenings/ancillary studies Immunization History  Administered Date(s) Administered  . Tdap 10/30/2017  . Zoster Recombinat (Shingrix) 06/06/2018, 08/07/2018   Health Maintenance Due  Topic Date Due  . INFLUENZA VACCINE  12/20/2018   4. Prostate cancer screening- *** No results found for: PSA 5. Colon cancer screening -  6. Skin cancer screening- advised regular sunscreen use. Denies worrisome, changing, or new skin lesions.  7. Patient is not a  smoker  Status of chronic or acute concerns   No specialty comments available. *** No diagnosis found.  Recommended follow up: ***No follow-ups on file. No future appointments.  Chief Complaint  Patient presents with  . Annual Exam   Lab/Order associations:*** fasting No diagnosis found.  No orders of the defined types were placed in this encounter.   Return precautions advised.  Tobe Sos, CMA

## 2019-08-13 ENCOUNTER — Encounter: Payer: Self-pay | Admitting: Family Medicine

## 2020-06-14 NOTE — Progress Notes (Signed)
Phone: [phone]   Subjective:  Patient presents today for their annual physical. Chief complaint-noted.   See problem oriented charting- ROS- full  review of systems was completed and negative  except for: abdominal pain, blood in stool 4-6 months- better lately- usually bright red- also thinks may have had fissure  The following were reviewed and entered/updated in epic: Past Medical History:  Diagnosis Date  . Cancer ([place])    Skin cancer, melanoma and basel cell  . History of cardiac catheterization    a. LHC 8/16:  no CAD, EF 55-65%, Normal caliber aortic root with no evidence of dissection  . History of echocardiogram    Echo 8/16: Mild LVH, vigorous LVF, EF 65-70%, normal wall motion, normal diastolic function, normal RV function, trivial AI, trivial PI, trivial TR  . History of nuclear stress test    a. Myoview 8/16:  Nuclear stress EF: 63%.  Low risk study with no prior infarct or ischemia.   Marland Kitchen History of skin cancer    melanoma removed x3  . Molluscum contagiosum 12/30/2013  . Pectus excavatum   . Peyronie's disease   . RECTAL FISSURE 02/27/2010   Qualifier: Diagnosis of  By: Sherren Mocha MD, Jory Ee    Patient Active Problem List   Diagnosis Date Noted  . History of melanoma. [facility]  02/27/2010    Priority: Medium  . Precordial pain 2016- cause never discovered after intense swim/snorkeling. no recurrence      Priority: Low  . Snoring-  no cpap. occurs when sleeps on back 09/10/2013    Priority: Low  . Peyronie's disease - s/p surgery. still bothered by shortening/scar tissue  12/21/2011    Priority: Low  . Right hip pain 02/09/2019  . Right lateral epicondylitis 01/07/2019  . Pain of left calf 10/28/2018  . Lateral epicondylitis of right elbow 08/30/2017  . Bursitis of right shoulder 05/19/2017  . History of melanoma in situ 07/17/2012   Past Surgical History:  Procedure Laterality Date  . CARDIAC CATHETERIZATION N/A 12/30/2014   Procedure: Left Heart  Cath and Coronary Angiography;  Surgeon: Burnell Blanks, MD;  Location: [facility];  Service: Cardiovascular;  Laterality: N/A;  . EYE SURGERY     lasik  . MELANOMA EXCISION  2011   x3  . NESBIT PROCEDURE  12/21/2011   Peyronie related. Procedure: NESBIT PROCEDURE;  Surgeon: Claybon Jabs, MD;  Location: [facility];  Service: Urology;  Laterality: N/A;  with 16 dot plication  . VASECTOMY      Family History  Problem Relation Age of Onset  . Heart disease Mother   . Heart disease Father   . Hyperlipidemia Father   . Hypertension Father   . Heart attack Father 2       s/p CABG  . Dementia Father   . Colon polyps Father   . Healthy Sister   . Colon cancer Neg Hx   . Esophageal cancer Neg Hx   . Rectal cancer Neg Hx   . Stomach cancer Neg Hx     Medications- reviewed and updated Current Outpatient Medications  Medication Sig Dispense Refill  . APPLE CIDER VINEGAR PO Take by mouth.    . Ascorbic Acid (VITAMIN C) 500 MG CHEW     . Cholecalciferol (VITAMIN D3 PO)     . Misc Natural Products (TURMERIC CURCUMIN) CAPS     . Zinc 50 MG CAPS      No current facility-administered medications for  this visit.    Allergies-reviewed and updated No Known Allergies  Social History   Social History Narrative   Married 27 years in 2020. 3 kids- 42 finished law school, 74 at [place], 72 daughter at [place] in 2021.       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       Hobbies: home improvement, work in yard, travel   Objective  Objective:  BP 118/88   Pulse (!) 53   Temp 97.6 F (36.4 C) (Temporal)   Ht 6\' 2"  (1.88 m)   Wt 208 lb (94.3 kg)   SpO2 99%   BMI 26.71 kg/m  Gen: NAD, resting comfortably HEENT: Mucous membranes are moist. Oropharynx normal Neck: no thyromegaly CV: RRR no murmurs rubs or gallops Lungs: CTAB no crackles, wheeze, rhonchi Abdomen: soft/nontender/nondistended/normal bowel sounds. No rebound or  guarding.  Ext: no edema Skin: warm, dry Neuro: grossly normal, moves all extremities, PERRLA   Assessment and Plan  55 y.o. male presenting for annual physical.  Health Maintenance counseling: 1. Anticipatory guidance: Patient counseled regarding regular dental exams -q6 months, eye exams - every other yearly- considering update,  avoiding smoking and second hand smoke , limiting alcohol to 2 beverages per day.   2. Risk factor reduction:  Advised patient of need for regular exercise and diet rich and fruits and vegetables to reduce risk of heart attack and stroke. Exercise- 2.25 miles a day at under 10 minute pace most days of the week- does treadmill in basement. Diet-has been trying to do a meal replacement shake but may pull off due to some bloating/stomach upset.  Wt Readings from Last 3 Encounters:  06/15/20 208 lb (94.3 kg)  06/09/19 211 lb 6.4 oz (95.9 kg)  03/09/19 215 lb (97.5 kg)  3. Immunizations/screenings/ancillary studies- has not done covid 19 booster but he did have infection nov 2020 and wonders if he had it again- covid 30 went through house in holidays and had symptoms but was not able to get tested. Declines flu shot.  Immunization History  Administered Date(s) Administered  . Moderna Sars-Covid-2 Vaccination 07/01/2019, 07/29/2019  . Tdap 10/30/2017  . Zoster Recombinat (Shingrix) 06/06/2018, 08/07/2018  4. Prostate cancer screening- no family history, start at age 45  5. Colon cancer screening - 10/09/2018  With 10 year repeat 6. Skin cancer screening-hx melanoma- follows with [facility]. advised regular sunscreen use or covering up 7. former smoker- quit 2003 mainly cigars with golf. No cigarettes since HS. AAA san at 35 8. STD screening - only active with wife  Status of chronic or acute concerns   # elevated BP- keep eye on blood pressure- high normal diastolic  #hyperlipidemia S: Medication:none   Lab Results  Component Value Date   CHOL 187  06/09/2019   HDL 41.00 06/09/2019   LDLCALC 131 (H) 06/09/2019   LDLDIRECT 131.0 12/14/2014   TRIG 75.0 06/09/2019   CHOLHDL 5 06/09/2019   A/P: 10 year ascvd risk low risk at 4.1% as long as remains below 7.5% will not consider statin for primary prevention  #Some abdominal pain- trying to replace a meal with a shake- thinks the yogurt/milk causing more gassiness  #thinks he had a fissure vs hemorrhoids- much improved lately- worse in the fall. Sitz baths helpful.   Recommended follow up: Return in about 1 year (around 06/15/2021) for physical or sooner if needed.  Lab/Order associations: fasting   ICD-10-CM   1. Preventative health care  Z00.00  Lipid panel    CBC with Differential/Platelet    Comprehensive metabolic panel    Hepatitis C antibody  2. Mild hyperlipidemia  E78.5 Lipid panel    CBC with Differential/Platelet    Comprehensive metabolic panel  3. Encounter for hepatitis C screening test for low risk patient  Z11.59 Hepatitis C antibody   Return precautions advised.  Garret Reddish, MD

## 2020-06-14 NOTE — Patient Instructions (Addendum)
Health Maintenance Due  Topic Date Due  . COVID-19 Vaccine (3 - Moderna risk 4-dose series)- holding off since likely had covid in December and already vaccinated x2 plus prior covid infection in 2020 08/26/2019  . INFLUENZA VACCINE - declined Never done   Please stop by lab before you go If you have mychart- we will send your results within 3 business days of [place] receiving them.  If you do not have mychart- we will call you about results within 5 business days of [place] receiving them.  *please also note that you will see labs on mychart as soon as they post. I will later go in and write notes on them- will say "notes from Dr. Yong Channel"  Health Maintenance Due  Topic Date Due  . Hepatitis C Screening Done today in office.  Never done   Recommended follow up: Return in about 1 year (around 06/15/2021) for physical or sooner if needed.

## 2020-06-15 ENCOUNTER — Ambulatory Visit (INDEPENDENT_AMBULATORY_CARE_PROVIDER_SITE_OTHER): Payer: BLUE CROSS/BLUE SHIELD | Admitting: Family Medicine

## 2020-06-15 ENCOUNTER — Other Ambulatory Visit: Payer: Self-pay

## 2020-06-15 ENCOUNTER — Encounter: Payer: Self-pay | Admitting: Family Medicine

## 2020-06-15 VITALS — BP 118/88 | HR 53 | Temp 97.6°F | Ht 74.0 in | Wt 208.0 lb

## 2020-06-15 DIAGNOSIS — E785 Hyperlipidemia, unspecified: Secondary | ICD-10-CM

## 2020-06-15 DIAGNOSIS — Z0000 Encounter for general adult medical examination without abnormal findings: Secondary | ICD-10-CM

## 2020-06-15 DIAGNOSIS — Z1159 Encounter for screening for other viral diseases: Secondary | ICD-10-CM

## 2020-06-15 LAB — CBC WITH DIFFERENTIAL/PLATELET
Basophils Absolute: 0.1 10*3/uL (ref 0.0–0.1)
Basophils Relative: 1 % (ref 0.0–3.0)
Eosinophils Absolute: 0.1 10*3/uL (ref 0.0–0.7)
Eosinophils Relative: 1.1 % (ref 0.0–5.0)
HCT: 44.4 % (ref 39.0–52.0)
Hemoglobin: 14.8 g/dL (ref 13.0–17.0)
Lymphocytes Relative: 31.3 % (ref 12.0–46.0)
Lymphs Abs: 1.6 10*3/uL (ref 0.7–4.0)
MCHC: 33.4 g/dL (ref 30.0–36.0)
MCV: 88 fl (ref 78.0–100.0)
Monocytes Absolute: 0.3 10*3/uL (ref 0.1–1.0)
Monocytes Relative: 5.1 % (ref 3.0–12.0)
Neutro Abs: 3.2 10*3/uL (ref 1.4–7.7)
Neutrophils Relative %: 61.5 % (ref 43.0–77.0)
Platelets: 222 10*3/uL (ref 150.0–400.0)
RBC: 5.05 Mil/uL (ref 4.22–5.81)
RDW: 13.1 % (ref 11.5–15.5)
WBC: 5.2 10*3/uL (ref 4.0–10.5)

## 2020-06-15 LAB — LIPID PANEL
Cholesterol: 187 mg/dL (ref 0–200)
HDL: 36.2 mg/dL — ABNORMAL LOW (ref >39.00)
LDL Cholesterol: 132 mg/dL — ABNORMAL HIGH (ref 0–99)
NonHDL: 150.88
Total CHOL/HDL Ratio: 5
Triglycerides: 92 mg/dL (ref 0.0–149.0)
VLDL: 18.4 mg/dL (ref 0.0–40.0)

## 2020-06-15 LAB — COMPREHENSIVE METABOLIC PANEL
ALT: 30 U/L (ref 0–53)
AST: 22 U/L (ref 0–37)
Albumin: 4.9 g/dL (ref 3.5–5.2)
Alkaline Phosphatase: 59 U/L (ref 39–117)
BUN: 16 mg/dL (ref 6–23)
CO2: 30 mEq/L (ref 19–32)
Calcium: 10.1 mg/dL (ref 8.4–10.5)
Chloride: 103 mEq/L (ref 96–112)
Creatinine, Ser: 0.97 mg/dL (ref 0.40–1.50)
GFR: 89.84 mL/min (ref >60.00)
Glucose, Bld: 88 mg/dL (ref 70–99)
Potassium: 4.3 mEq/L (ref 3.5–5.1)
Sodium: 139 mEq/L (ref 135–145)
Total Bilirubin: 0.7 mg/dL (ref 0.2–1.2)
Total Protein: 7.8 g/dL (ref 6.0–8.3)

## 2020-06-16 LAB — HEPATITIS C ANTIBODY
Hepatitis C Ab: NONREACTIVE
SIGNAL TO CUT-OFF: 0.01 (ref <1.00)

## 2020-09-16 IMAGING — DX DG HIP (WITH OR WITHOUT PELVIS) 2-3V*R*
2 series · 2 of 2 positions shown · non-contrast
Comparison: None.

CLINICAL DATA: Right hip pain

EXAM:
DG HIP (WITH OR WITHOUT PELVIS) 2-3V RIGHT

[hip ap]
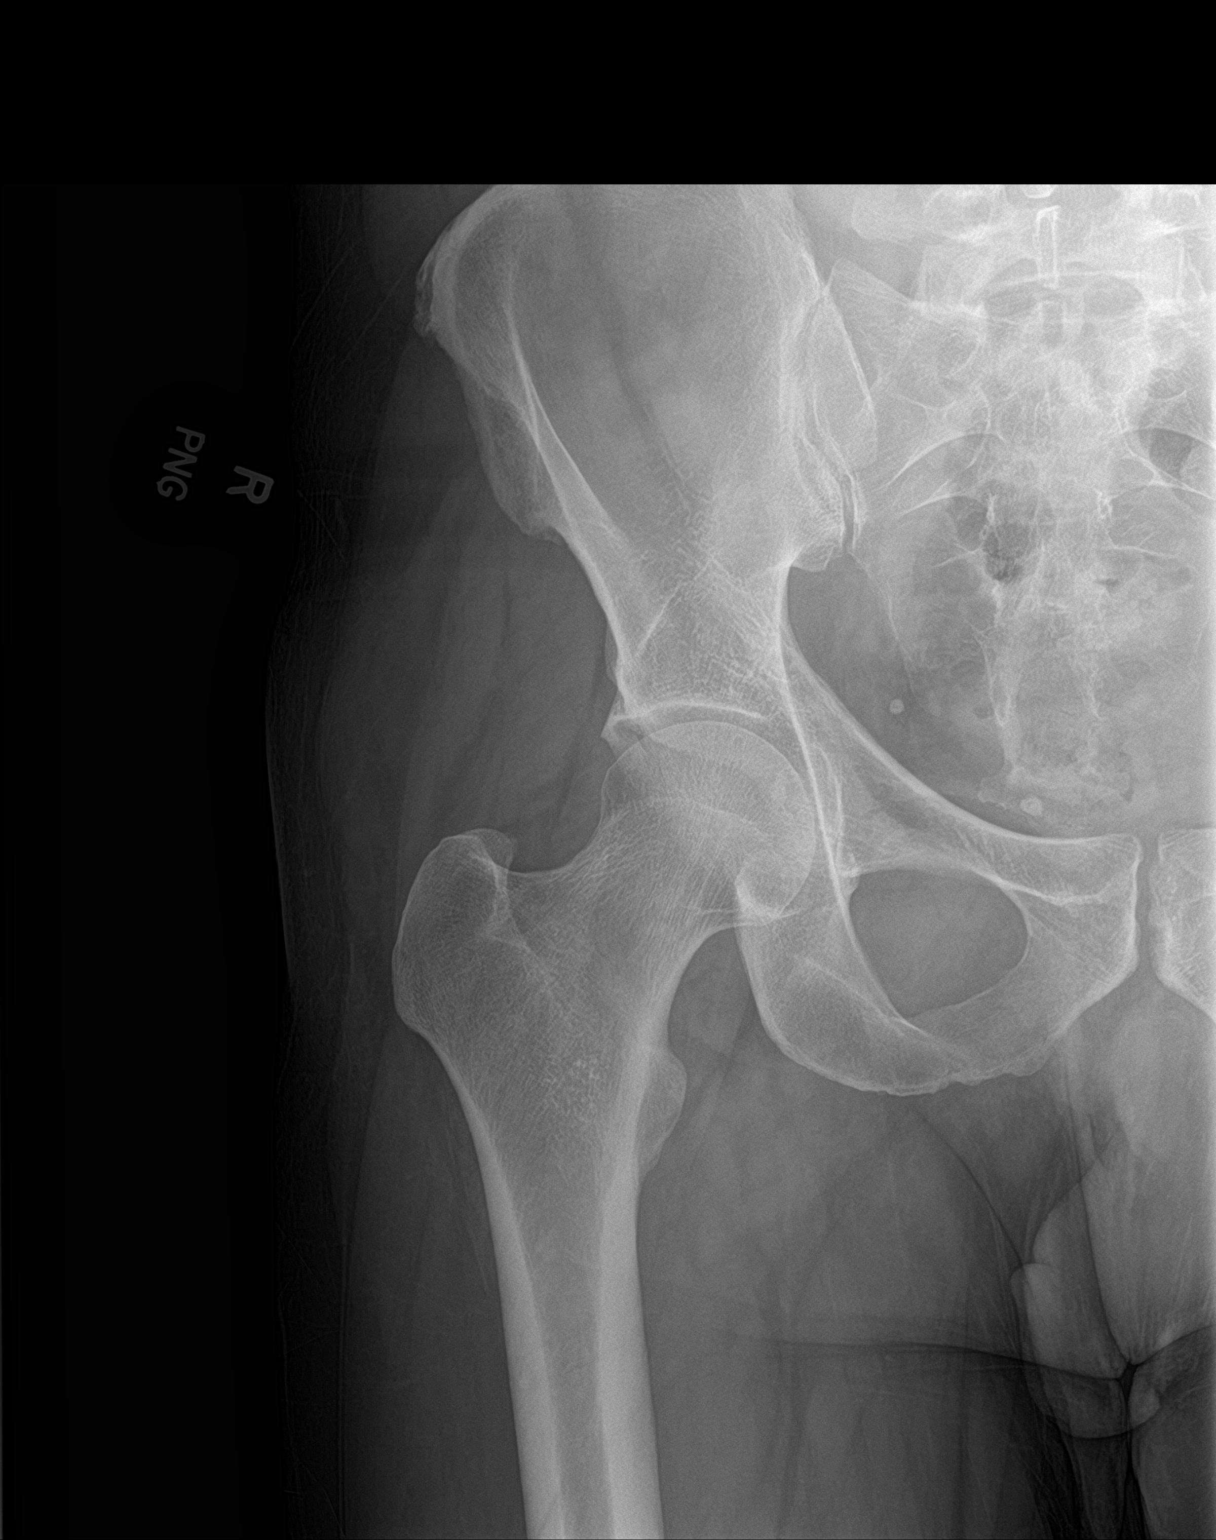

[hip lat]
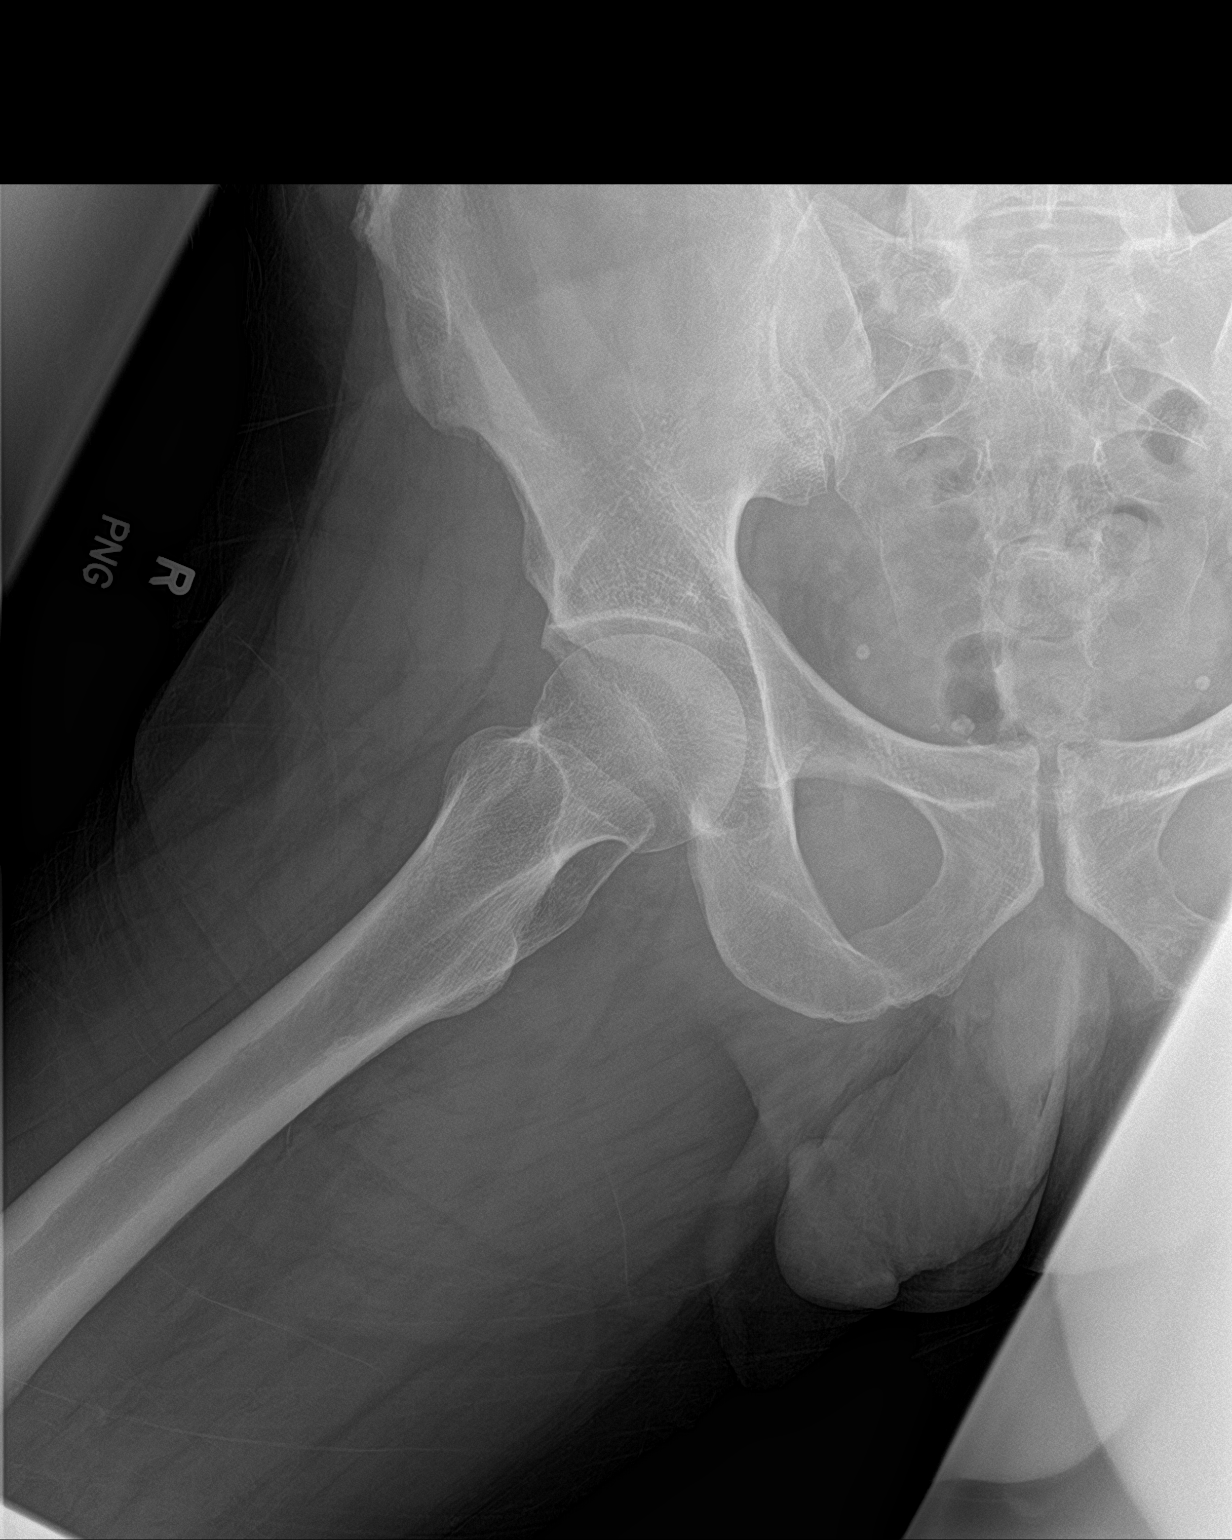

[2 of 2 positions shown; findings below may reference images not displayed]

FINDINGS: There is no evidence of hip fracture or dislocation. There is no
evidence of arthropathy or other focal bone abnormality.
IMPRESSION: No acute osseous finding

## 2021-02-17 NOTE — Progress Notes (Signed)
Phone [phone] In person visit   Subjective:   Juan Bender is a 55 y.o. year old very pleasant male patient who presents for/with See problem oriented charting Chief Complaint  Patient presents with   Back Injury    Pt says that this happened a week ago. He was prescribed Flexeril and Diclofenac. The Flexeril helps him sleep at night, but has not felt the Diclofenac to be helpful. It is most painful when bending and twisting, but not when sitting. He denies SOB, or chest discomfort.     This visit occurred during the SARS-CoV-2 public health emergency.  Safety protocols were in place, including screening questions prior to the visit, additional usage of staff PPE, and extensive cleaning of exam room while observing appropriate contact time as indicated for disinfecting solutions.   Past Medical History-  Patient Active Problem List   Diagnosis Date Noted   History of melanoma. [facility]  02/27/2010    Priority: 2.   Precordial pain 2016- cause never discovered after intense swim/snorkeling. no recurrence      Priority: 3.   Snoring-  no cpap. occurs when sleeps on back 09/10/2013    Priority: 3.   Peyronie's disease - s/p surgery. still bothered by shortening/scar tissue  12/21/2011    Priority: 3.   Right hip pain 02/09/2019   Right lateral epicondylitis 01/07/2019   Pain of left calf 10/28/2018   Lateral epicondylitis of right elbow 08/30/2017   Bursitis of right shoulder 05/19/2017   History of melanoma in situ 07/17/2012    Medications- reviewed and updated Current Outpatient Medications  Medication Sig Dispense Refill   APPLE CIDER VINEGAR PO Take by mouth.     Ascorbic Acid (VITAMIN C) 500 MG CHEW      Cholecalciferol (VITAMIN D3 PO)      cyclobenzaprine (FLEXERIL) 10 MG tablet Take 1 tablet (10 mg total) by mouth 3 (three) times daily as needed for muscle spasms (Do not drive for 8 hours after taking). 30 tablet 0   Misc Natural Products (TURMERIC  CURCUMIN) CAPS      Zinc 50 MG CAPS      No current facility-administered medications for this visit.     Objective:  BP (!) 122/54   Pulse 75   Temp 98 F (36.7 C) (Temporal)   Ht 6\' 2"  (1.88 m)   Wt 217 lb 3.2 oz (98.5 kg)   SpO2 99%   BMI 27.89 kg/m  Gen: NAD, resting comfortably CV: RRR no murmurs rubs or gallops Lungs: CTAB no crackles, wheeze, rhonchi Abdomen: soft/nontender/nondistended/normal bowel sounds. No rebound or guarding.  Ext: no edema Skin: warm, dry Back - Normal skin, Spine with normal alignment and no deformity.  No tenderness to vertebral process palpation.  Paraspinous muscles are very tender and witht spasm in the lower portions of thoracic and upper portions of lumbar spine.   Pain limits range of motion.  Negative Straight leg raise for paresthesias but has severe pain in the left low back with movement of the leg. Neuro- no saddle anesthesia, 5/5 strength lower extremities, 2+ reflexes     Assessment and Plan   # left low Back Pain #Blood in stools after fall S:Went to [place] and slipped in a tub  (sept 25th on Sunday) on the first day and strained his back- felt instant pain with hitting left low lateral back on side of tub and then tumbling out of tub after first impact. Evelina Bucy 800mg  advil 3-4x  a day and had some left over flexeril that he took at night. Pain at worst 8-9/10 at first. At present 3-4/10- ok with walking but bending or twisting worsens pain. Tenderness to palpation off to the side- initially had some midline but that improved. No pain into leg but some sharp pains into groin at times. Improving slightly with time. Last took advil yesterday.   Passing blood in his stool from Monday to Saturday  (slightly lighter on Saturday and then completely gone next check.  (1 Bm per day in AM-started advil on Sunday prior to the Monday but did not have a BM until next AM on Monday) on Sunday and Monday. Entire bowel movement was covered with red- and  could not see fecal matter due to density of the blood.   Previous imaging-none of low back. Hx of hemorrhoids but did not feel any with this. No melena.   ROS-No saddle anesthesia, bladder incontinence, fecal incontinence, weakness in extremity, numbness or tingling in extremity.  history of cancer other than melanoma, fever, chills, unintentional weight loss, recent bacterial infection, recent IV drug use, HIV, pain worse at night or while supine.  - no chest pain, shortness of breath, abnormal fatigue A/P: 55 year old male with history of melanoma followed by [facility] dermatology presenting after a fall onto his left thoracic and low back on the side of the tub with significant severe pain followed by 6 days of significantly bloody bowel movements. - With history of melanoma we opted to do lumbar and thoracic low back imaging-want to rule out compression fracture.  No midline pain at this time but had some previously. - For pain management will give updated prescription of Flexeril-prior prescription was expired.  Also recommended heating pad.  Ideally with prior blood in the stool avoid Advil/other NSAIDs -Patient's bigger concern was the blood in the stool-we will refer to GI.  Also check CBC and CMP as well as stool cards. -Considered abdominal imaging-no blood in the urine reported but would reconsider this if any significant changes to renal function on CMP  Recommended follow up: Return for as needed for new, worsening, persistent symptoms. Future Appointments  Date Time Provider [place]  06/16/2021  9:20 AM Marin Olp, MD [facility]    Lab/Order associations:   ICD-10-CM   1. Acute left-sided thoracic back pain  M54.6 CBC with Differential/Platelet    Comprehensive metabolic panel    DG Thoracic Spine W/Swimmers    Ambulatory referral to Gastroenterology    2. Blood in stool  K92.1 CBC with Differential/Platelet    Comprehensive metabolic panel    Fecal occult  blood, imunochemical    Ambulatory referral to Gastroenterology    3. Acute left-sided low back pain without sciatica  M54.50 CBC with Differential/Platelet    Comprehensive metabolic panel    DG Lumbar Spine Complete    Ambulatory referral to Gastroenterology    4. History of melanoma. [facility]   [phone]       Meds ordered this encounter  Medications   cyclobenzaprine (FLEXERIL) 10 MG tablet    Sig: Take 1 tablet (10 mg total) by mouth 3 (three) times daily as needed for muscle spasms (Do not drive for 8 hours after taking).    Dispense:  30 tablet    Refill:  0    Time Spent: 31 minutes of total time (2:30 PM- 3:01 PM) was spent on the date of the encounter performing the following actions: chart review prior to  seeing the patient, obtaining history, performing a medically necessary exam, counseling on the treatment plan, placing orders, and documenting in our EHR.    I,Jada Bradford,acting as a scribe for Garret Reddish, MD.,have documented all relevant documentation on the behalf of Garret Reddish, MD,as directed by  Garret Reddish, MD while in the presence of Garret Reddish, MD.   I, Garret Reddish, MD, have reviewed all documentation for this visit. The documentation on 02/20/21 for the exam, diagnosis, procedures, and orders are all accurate and complete.   Return precautions advised.  Garret Reddish, MD

## 2021-02-20 ENCOUNTER — Ambulatory Visit: Payer: BLUE CROSS/BLUE SHIELD | Admitting: Family Medicine

## 2021-02-20 ENCOUNTER — Encounter: Payer: Self-pay | Admitting: Family Medicine

## 2021-02-20 ENCOUNTER — Telehealth: Payer: Self-pay

## 2021-02-20 ENCOUNTER — Other Ambulatory Visit: Payer: Self-pay

## 2021-02-20 ENCOUNTER — Ambulatory Visit (INDEPENDENT_AMBULATORY_CARE_PROVIDER_SITE_OTHER)
Admission: RE | Admit: 2021-02-20 | Discharge: 2021-02-20 | Disposition: A | Discharge status: Home / Self Care | Payer: BLUE CROSS/BLUE SHIELD | Source: Ambulatory Visit | Attending: Family Medicine | Admitting: Family Medicine

## 2021-02-20 VITALS — BP 122/54 | HR 75 | Temp 98.0°F | Ht 74.0 in | Wt 217.2 lb

## 2021-02-20 DIAGNOSIS — M545 Low back pain, unspecified: Secondary | ICD-10-CM

## 2021-02-20 DIAGNOSIS — K921 Melena: Secondary | ICD-10-CM | POA: Diagnosis not present

## 2021-02-20 DIAGNOSIS — M549 Dorsalgia, unspecified: Secondary | ICD-10-CM

## 2021-02-20 DIAGNOSIS — M546 Pain in thoracic spine: Secondary | ICD-10-CM | POA: Diagnosis not present

## 2021-02-20 DIAGNOSIS — R109 Unspecified abdominal pain: Secondary | ICD-10-CM

## 2021-02-20 DIAGNOSIS — E785 Hyperlipidemia, unspecified: Secondary | ICD-10-CM

## 2021-02-20 DIAGNOSIS — Z85828 Personal history of other malignant neoplasm of skin: Secondary | ICD-10-CM | POA: Diagnosis not present

## 2021-02-20 MED ORDER — CYCLOBENZAPRINE HCL 10 MG PO TABS: 10.0000 mg | ORAL_TABLET | Freq: Three times a day (TID) | ORAL | 0 refills | Status: DC | PRN

## 2021-02-20 NOTE — Telephone Encounter (Signed)
Patient Name: Juan Bender Gender: Male DOB: Sep 24, 1965 Age: 55 Y 18 D Return Phone Number: [phone] (Primary), [phone] (Secondary) Address: City/ State/ Zip: [place]  [address] Client [place] [facility] [place] [facility] Garret Reddish- MD Contact Type Call Who Is Calling Patient / Member / Family / Caregiver Call Type Triage / Clinical Relationship To Patient Self Return Phone Number [phone] (Secondary) Chief Complaint Blood In Stool Reason for Call Symptomatic / Request for [name] states he fell out of his bath and hit his back and now is having blood in stool. Translation No Disp. Time Eilene Ghazi Time) Disposition Final User 02/17/2021 8:30:30 AM Attempt made - message left Carmon, RN, Langley Gauss 02/17/2021 8:45:42 AM Attempt made - message left Carmon, RN, Langley Gauss 02/17/2021 9:36:15 AM FINAL ATTEMPT MADE - no message left Yes Carmon, RN, Langley Gauss Comments User: Romeo Apple, RN Date/Time Eilene Ghazi Time): 02/17/2021 9:36:05 AM

## 2021-02-20 NOTE — Patient Instructions (Addendum)
Health Maintenance Due  Topic Date Due   INFLUENZA VACCINE  - Please consider getting your flu shot this season. If you get this outside of our office, please let us know.   Never done   Team please give stool cards.   Refilled muscle relaxant. Try heating pad 20 mins for 3 times a day as well.   We will call you within two weeks about your referral to GI due to blood in stool. If you do not hear within 2 weeks, give [place] a call.    Please stop by lab before you go If you have mychart- we will send your results within 3 business days of [place] receiving them.  If you do not have mychart- we will call you about results within 5 business days of [place] receiving them.  *please also note that you will see labs on mychart as soon as they post. I will later go in and write notes on them- will say "notes from Dr. Yong Channel"   Please go to [facility]  - located [address] across the street from [place] - in the basement - Hours: 8:30-5:00 PM M-F (with lunch from 12:30- 1 PM). You do NOT need an appointment.  - Please ensure you are covid symptom free before going in(No fever, chills, cough, congestion, runny nose, shortness of breath, fatigue, body aches, sore throat, headache, nausea, vomiting, diarrhea, or new loss of taste or smell. No known contacts with covid 19 or someone being tested for covid 19)   Recommended follow up: Return for as needed for new, worsening, persistent symptoms.

## 2021-02-20 NOTE — Telephone Encounter (Signed)
Pt seeing Hunter today.

## 2021-02-21 LAB — CBC WITH DIFFERENTIAL/PLATELET
Basophils Absolute: 0.1 10*3/uL (ref 0.0–0.1)
Basophils Relative: 1 % (ref 0.0–3.0)
Eosinophils Absolute: 0.1 10*3/uL (ref 0.0–0.7)
Eosinophils Relative: 0.9 % (ref 0.0–5.0)
HCT: 44.8 % (ref 39.0–52.0)
Hemoglobin: 15 g/dL (ref 13.0–17.0)
Lymphocytes Relative: 19.8 % (ref 12.0–46.0)
Lymphs Abs: 1.8 10*3/uL (ref 0.7–4.0)
MCHC: 33.4 g/dL (ref 30.0–36.0)
MCV: 89.4 fl (ref 78.0–100.0)
Monocytes Absolute: 0.5 10*3/uL (ref 0.1–1.0)
Monocytes Relative: 5.4 % (ref 3.0–12.0)
Neutro Abs: 6.5 10*3/uL (ref 1.4–7.7)
Neutrophils Relative %: 72.9 % (ref 43.0–77.0)
Platelets: 240 10*3/uL (ref 150.0–400.0)
RBC: 5.01 Mil/uL (ref 4.22–5.81)
RDW: 13.1 % (ref 11.5–15.5)
WBC: 8.9 10*3/uL (ref 4.0–10.5)

## 2021-02-21 LAB — COMPREHENSIVE METABOLIC PANEL
ALT: 32 U/L (ref 0–53)
AST: 26 U/L (ref 0–37)
Albumin: 4.6 g/dL (ref 3.5–5.2)
Alkaline Phosphatase: 75 U/L (ref 39–117)
BUN: 21 mg/dL (ref 6–23)
CO2: 29 mEq/L (ref 19–32)
Calcium: 9.8 mg/dL (ref 8.4–10.5)
Chloride: 98 mEq/L (ref 96–112)
Creatinine, Ser: 1.08 mg/dL (ref 0.40–1.50)
GFR: 78.6 mL/min (ref >60.00)
Glucose, Bld: 100 mg/dL — ABNORMAL HIGH (ref 70–99)
Potassium: 4 mEq/L (ref 3.5–5.1)
Sodium: 137 mEq/L (ref 135–145)
Total Bilirubin: 0.5 mg/dL (ref 0.2–1.2)
Total Protein: 7.6 g/dL (ref 6.0–8.3)

## 2021-02-22 ENCOUNTER — Other Ambulatory Visit (INDEPENDENT_AMBULATORY_CARE_PROVIDER_SITE_OTHER): Payer: BLUE CROSS/BLUE SHIELD

## 2021-02-22 DIAGNOSIS — K921 Melena: Secondary | ICD-10-CM

## 2021-02-22 LAB — FECAL OCCULT BLOOD, IMMUNOCHEMICAL: Fecal Occult Bld: NEGATIVE

## 2021-06-16 ENCOUNTER — Encounter: Payer: BLUE CROSS/BLUE SHIELD | Admitting: Family Medicine

## 2021-10-31 ENCOUNTER — Encounter: Payer: Self-pay | Admitting: Family Medicine

## 2021-10-31 ENCOUNTER — Ambulatory Visit (INDEPENDENT_AMBULATORY_CARE_PROVIDER_SITE_OTHER): Payer: BLUE CROSS/BLUE SHIELD | Admitting: Family Medicine

## 2021-10-31 VITALS — BP 112/60 | HR 67 | Temp 97.7°F | Ht 74.0 in | Wt 207.4 lb

## 2021-10-31 DIAGNOSIS — E034 Atrophy of thyroid (acquired): Secondary | ICD-10-CM

## 2021-10-31 DIAGNOSIS — Z0000 Encounter for general adult medical examination without abnormal findings: Secondary | ICD-10-CM

## 2021-10-31 DIAGNOSIS — G2581 Restless legs syndrome: Secondary | ICD-10-CM | POA: Diagnosis not present

## 2021-10-31 DIAGNOSIS — E785 Hyperlipidemia, unspecified: Secondary | ICD-10-CM | POA: Diagnosis not present

## 2021-10-31 LAB — COMPREHENSIVE METABOLIC PANEL
ALT: 27 U/L (ref 0–53)
AST: 22 U/L (ref 0–37)
Albumin: 4.6 g/dL (ref 3.5–5.2)
Alkaline Phosphatase: 60 U/L (ref 39–117)
BUN: 17 mg/dL (ref 6–23)
CO2: 31 mEq/L (ref 19–32)
Calcium: 9.7 mg/dL (ref 8.4–10.5)
Chloride: 104 mEq/L (ref 96–112)
Creatinine, Ser: 1.09 mg/dL (ref 0.40–1.50)
GFR: 77.35 mL/min (ref >60.00)
Glucose, Bld: 95 mg/dL (ref 70–99)
Potassium: 3.9 mEq/L (ref 3.5–5.1)
Sodium: 141 mEq/L (ref 135–145)
Total Bilirubin: 0.7 mg/dL (ref 0.2–1.2)
Total Protein: 7.1 g/dL (ref 6.0–8.3)

## 2021-10-31 LAB — IBC + FERRITIN
Ferritin: 158.5 ng/mL (ref 22.0–322.0)
Iron: 112 ug/dL (ref 42–165)
Saturation Ratios: 29.1 % (ref 20.0–50.0)
TIBC: 385 ug/dL (ref 250.0–450.0)
Transferrin: 275 mg/dL (ref 212.0–360.0)

## 2021-10-31 LAB — LIPID PANEL
Cholesterol: 182 mg/dL (ref 0–200)
HDL: 38.7 mg/dL — ABNORMAL LOW (ref >39.00)
LDL Cholesterol: 114 mg/dL — ABNORMAL HIGH (ref 0–99)
NonHDL: 143.33
Total CHOL/HDL Ratio: 5
Triglycerides: 148 mg/dL (ref 0.0–149.0)
VLDL: 29.6 mg/dL (ref 0.0–40.0)

## 2021-10-31 LAB — CBC WITH DIFFERENTIAL/PLATELET
Basophils Absolute: 0.1 10*3/uL (ref 0.0–0.1)
Basophils Relative: 0.7 % (ref 0.0–3.0)
Eosinophils Absolute: 0.1 10*3/uL (ref 0.0–0.7)
Eosinophils Relative: 0.9 % (ref 0.0–5.0)
HCT: 42.8 % (ref 39.0–52.0)
Hemoglobin: 14.3 g/dL (ref 13.0–17.0)
Lymphocytes Relative: 26.3 % (ref 12.0–46.0)
Lymphs Abs: 1.8 10*3/uL (ref 0.7–4.0)
MCHC: 33.5 g/dL (ref 30.0–36.0)
MCV: 88.2 fl (ref 78.0–100.0)
Monocytes Absolute: 0.3 10*3/uL (ref 0.1–1.0)
Monocytes Relative: 4.5 % (ref 3.0–12.0)
Neutro Abs: 4.5 10*3/uL (ref 1.4–7.7)
Neutrophils Relative %: 67.6 % (ref 43.0–77.0)
Platelets: 206 10*3/uL (ref 150.0–400.0)
RBC: 4.86 Mil/uL (ref 4.22–5.81)
RDW: 13.5 % (ref 11.5–15.5)
WBC: 6.7 10*3/uL (ref 4.0–10.5)

## 2021-10-31 MED ORDER — TADALAFIL 20 MG PO TABS: 20.0000 mg | ORAL_TABLET | ORAL | 11 refills | Status: AC | PRN

## 2021-10-31 NOTE — Progress Notes (Signed)
Phone: [phone]   Subjective:  Patient presents today for their annual physical. Chief complaint-noted.   See problem oriented charting- ROS- full  review of systems was completed and negative  Per full ROS completed by patient   The following were reviewed and entered/updated in epic: Past Medical History:  Diagnosis Date   Cancer ([place])    Skin cancer, melanoma and basel cell   History of cardiac catheterization    a. LHC 8/16:  no CAD, EF 55-65%, Normal caliber aortic root with no evidence of dissection   History of echocardiogram    Echo 8/16: Mild LVH, vigorous LVF, EF 65-70%, normal wall motion, normal diastolic function, normal RV function, trivial AI, trivial PI, trivial TR   History of nuclear stress test    a. Myoview 8/16:  Nuclear stress EF: 63%.  Low risk study with no prior infarct or ischemia.    History of skin cancer    melanoma removed x3   Molluscum contagiosum 12/30/2013   Pectus excavatum    Peyronie's disease    RECTAL FISSURE 02/27/2010   Qualifier: Diagnosis of  By: Sherren Mocha MD, Jory Ee    Patient Active Problem List   Diagnosis Date Noted   History of melanoma. [facility]  02/27/2010    Priority: Medium    Precordial pain 2016- cause never discovered after intense swim/snorkeling. no recurrence      Priority: Low   Snoring-  no cpap. occurs when sleeps on back 09/10/2013    Priority: Low   Peyronie's disease - s/p surgery. still bothered by shortening/scar tissue  12/21/2011    Priority: Low   Right hip pain 02/09/2019   Right lateral epicondylitis 01/07/2019   Pain of left calf 10/28/2018   Lateral epicondylitis of right elbow 08/30/2017   Bursitis of right shoulder 05/19/2017   History of melanoma in situ 07/17/2012   Past Surgical History:  Procedure Laterality Date   CARDIAC CATHETERIZATION N/A 12/30/2014   Procedure: Left Heart Cath and Coronary Angiography;  Surgeon: Burnell Blanks, MD;  Location: [facility];   Service: Cardiovascular;  Laterality: N/A;   EYE SURGERY     lasik   MELANOMA EXCISION  2011   x3   NESBIT PROCEDURE  12/21/2011   Peyronie related. Procedure: NESBIT PROCEDURE;  Surgeon: Claybon Jabs, MD;  Location: [facility];  Service: Urology;  Laterality: N/A;  with 16 dot plication   VASECTOMY      Family History  Problem Relation Age of Onset   Heart disease Mother    Heart disease Father    Hyperlipidemia Father    Hypertension Father    Heart attack Father 76       s/p CABG   Dementia Father    Colon polyps Father    Healthy Sister    Colon cancer Neg Hx    Esophageal cancer Neg Hx    Rectal cancer Neg Hx    Stomach cancer Neg Hx     Medications- reviewed and updated Current Outpatient Medications  Medication Sig Dispense Refill   APPLE CIDER VINEGAR PO Take by mouth.     Ascorbic Acid (VITAMIN C) 500 MG CHEW      Cholecalciferol (VITAMIN D3 PO)      Misc Natural Products (TURMERIC CURCUMIN) CAPS      Zinc 50 MG CAPS      cyclobenzaprine (FLEXERIL) 10 MG tablet Take 1 tablet (10 mg total) by mouth 3 (three) times daily  as needed for muscle spasms (Do not drive for 8 hours after taking). 30 tablet 0   No current facility-administered medications for this visit.    Allergies-reviewed and updated No Known Allergies  Social History   Social History Narrative   Married 27 years in 2020. 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       Hobbies: home improvement, work in yard, travel   Objective  Objective:  BP 112/60   Pulse 67   Temp 97.7 F (36.5 C)   Ht '6\' 2"'$  (1.88 m)   Wt 207 lb 6.4 oz (94.1 kg)   SpO2 97%   BMI 26.63 kg/m  Gen: NAD, resting comfortably HEENT: Mucous membranes are moist. Oropharynx normal Neck:  no thyromegaly CV: RRR no murmurs rubs or gallops Lungs: CTAB no crackles, wheeze, rhonchi Abdomen: soft/nontender/nondistended/normal bowel sounds. No rebound or guarding.  Ext: no edema Skin: warm, dry Neuro: grossly normal, moves all extremities, PERRLA    Assessment and Plan  56 y.o. male presenting for annual physical.  Health Maintenance counseling: 1. Anticipatory guidance: Patient counseled regarding regular dental exams -q6 months, eye exams - needs updated exam but no recent issues,  avoiding smoking and second hand smoke , limiting alcohol to 2 beverages per day- 6 or less per week , no illicit drugs.   2. Risk factor reduction:  Advised patient of need for regular exercise and diet rich and fruits and vegetables to reduce risk of heart attack and stroke.  Exercise-last year running 2-1/4 miles on his treadmill under 10-minute pace most days of the week in his basement and does some weights mixed in for 20 minutes- 3 pm in afternoon. 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  Wt Readings from Last 3 Encounters:  10/31/21 207 lb 6.4 oz (94.1 kg)  02/20/21 217 lb 3.2 oz (98.5 kg)  06/15/20 208 lb (94.3 kg)  3. Immunizations/screenings/ancillary studies-declines further COVID-19 vaccination . Has not recently had covid Immunization History  Administered Date(s) Administered   Moderna Sars-Covid-2 Vaccination 07/01/2019, 07/29/2019   Tdap 10/30/2017   Zoster Recombinat (Shingrix) 06/06/2018, 08/07/2018  4. Prostate cancer screening-  no family history, start at age 28  5. Colon cancer screening -  10/09/2018 with 10-year repeat planned-last year had referred to consider colonoscopy after having some rectal bleeding after a fall-no further blood in stool 6. Skin cancer screening-history of melanoma and follows closely with [facility] q 6 months. advised regular sunscreen use. Denies  worrisome, changing, or new skin lesions.  7. Smoking associated screening (lung cancer screening, AAA screen 65-75, UA)-former smoker-quit in 2003-mainly cigars with golf later in life.   No cigarettes since high school.  We will plan on AAA scan at 65-offered urine- we will hold off since quit so long agao  8. STD screening - opts out as only active with wife  Status of chronic or acute concerns   #Fall with significant left low back pain and blood in the stools last October-we had referred to GI but I do not see that he has had a visit.  Required Flexeril for pain management at that time - Today he reports took 2  months to heal but no lingering issues - he ended up not seeing GI as felt better and no more rectal bleeding   #hyperlipidemia S: Medication:none other than otc fish oil  The 10-year ASCVD risk score (Arnett DK, et al., 2019) is: 4.6%  Lab Results  Component Value Date   CHOL 187 06/15/2020   HDL 36.20 (L) 06/15/2020   LDLCALC 132 (H) 06/15/2020   LDLDIRECT 131.0 12/14/2014   TRIG 92.0 06/15/2020   CHOLHDL 5 06/15/2020   A/P: update lipids and calculate ascvd risk again- as long as remains below 7.5% hold off on further workup- hed be potentially open to ct cardiac scoring if above threshold  #ED issues- working with [email or web address]. face gets reed, feels like BP is up (has not checked). Some benefit. No cp or sob with exercise or sex.  -we will trial tadalafil instead though could have similar SE profile- I am happy to send in either per his preference.   #restless legs- worse on a busy day but often notes needing to fidget legs at night- legs twitch in bed at night   Recommended follow up: Return in about 1 year (around 11/01/2022) for physical or sooner if needed.Schedule b4 you leave.  Lab/Order associations: fasting   ICD-10-CM   1. Preventative health care  Z00.00     2. Mild hyperlipidemia  E78.5       No orders of the defined types were placed in this  encounter.   Return precautions advised.  Garret Reddish, MD

## 2021-10-31 NOTE — Patient Instructions (Addendum)
Please stop by lab before you go If you have mychart- we will send your results within 3 business days of [place] receiving them.  If you do not have mychart- we will call you about results within 5 business days of [place] receiving them.  *please also note that you will see labs on mychart as soon as they post. I will later go in and write notes on them- will say "notes from Dr. Yong Channel"   Recommended follow up: Return in about 1 year (around 11/01/2022) for physical or sooner if needed.Schedule b4 you leave.

## 2021-12-04 ENCOUNTER — Telehealth: Payer: Self-pay | Admitting: Family Medicine

## 2021-12-04 NOTE — Telephone Encounter (Signed)
Vml for patient to call back and sch   [facility] [place] [facility] [place] Client Site [facility] [place] Type Call Who Is Calling Patient / Member / Family / Caregiver Caller Name Marmet Phone Number [phone] Patient Name Juan Bender Patient DOB 05-23-1965 Call Type Message Only Information Provided Reason for Call Request to Schedule Office Appointment Initial Comment Caller states that he is wanting to schedule an appt. due to he is having issues with his foot. Caller states that it is difficult to walk on his foot and it feels like he has something in it but he is able to see anything. Patient request to speak to RN No Additional Comment Informed caller of the office hours also. Disp. Time Disposition Final User 12/02/2021 8:06:20 AM General Information Provided Yes Minna Merritts Call Closed By: Minna Merritts Transaction Date/Time: 12/02/2021 8:01:19 AM (ET)

## 2022-02-12 ENCOUNTER — Encounter: Payer: Self-pay | Admitting: *Deleted

## 2022-05-03 ENCOUNTER — Encounter: Payer: Self-pay | Admitting: *Deleted

## 2022-05-07 ENCOUNTER — Encounter: Payer: Self-pay | Admitting: Family Medicine

## 2022-05-16 ENCOUNTER — Ambulatory Visit: Payer: BLUE CROSS/BLUE SHIELD | Admitting: Family Medicine

## 2022-05-16 ENCOUNTER — Encounter: Payer: Self-pay | Admitting: Family Medicine

## 2022-05-16 ENCOUNTER — Ambulatory Visit: Payer: BLUE CROSS/BLUE SHIELD | Admitting: Internal Medicine

## 2022-05-16 VITALS — BP 130/70 | HR 56 | Temp 97.6°F | Ht 74.0 in | Wt 223.4 lb

## 2022-05-16 DIAGNOSIS — S0101XA Laceration without foreign body of scalp, initial encounter: Secondary | ICD-10-CM | POA: Diagnosis not present

## 2022-05-16 DIAGNOSIS — Z4802 Encounter for removal of sutures: Secondary | ICD-10-CM | POA: Diagnosis not present

## 2022-05-16 NOTE — Progress Notes (Signed)
Subjective:     Patient ID: Juan Bender, male    DOB: 1965/08/29, 56 y.o.   MRN: [id number]  Chief Complaint  Patient presents with   Suture / Staple Removal    HPI Suture/staple removal-[place] [facility]-[place] ER. CT on 12/16.  7 staples placed on R scalp.  Needs removal.  No LOC.  Got Tdap.  No problems.  No ha/dizziness.   Has f/u w/Dr. Yong Channel re thyroid nodule seen on CT neck.   There are no preventive care reminders to display for this patient.  Past Medical History:  Diagnosis Date   Cancer ([place])    Skin cancer, melanoma and basel cell   History of cardiac catheterization    a. LHC 8/16:  no CAD, EF 55-65%, Normal caliber aortic root with no evidence of dissection   History of echocardiogram    Echo 8/16: Mild LVH, vigorous LVF, EF 65-70%, normal wall motion, normal diastolic function, normal RV function, trivial AI, trivial PI, trivial TR   History of nuclear stress test    a. Myoview 8/16:  Nuclear stress EF: 63%.  Low risk study with no prior infarct or ischemia.    History of skin cancer    melanoma removed x3   Molluscum contagiosum 12/30/2013   Pectus excavatum    Peyronie's disease    RECTAL FISSURE 02/27/2010   Qualifier: Diagnosis of  By: Sherren Mocha MD, Jory Ee     Past Surgical History:  Procedure Laterality Date   CARDIAC CATHETERIZATION N/A 12/30/2014   Procedure: Left Heart Cath and Coronary Angiography;  Surgeon: Burnell Blanks, MD;  Location: [facility];  Service: Cardiovascular;  Laterality: N/A;   EYE SURGERY     lasik   MELANOMA EXCISION  2011   x3   NESBIT PROCEDURE  12/21/2011   Peyronie related. Procedure: NESBIT PROCEDURE;  Surgeon: Claybon Jabs, MD;  Location[facility];  Service: Urology;  Laterality: N/A;  with 16 dot plication   VASECTOMY      Outpatient Medications Prior to Visit  Medication Sig Dispense Refill   APPLE CIDER VINEGAR PO Take by mouth.     Ascorbic Acid (VITAMIN C) 500  MG CHEW      Cetirizine HCl (ZYRTEC ALLERGY) 10 MG CAPS      Cholecalciferol (VITAMIN D3 PO)      Misc Natural Products (TURMERIC CURCUMIN) CAPS      Omega-3 Fatty Acids (FISH OIL) 1000 MG CAPS      tadalafil (CIALIS) 20 MG tablet Take 1 tablet (20 mg total) by mouth every other day as needed for erectile dysfunction. 10 tablet 11   Zinc 50 MG CAPS      No facility-administered medications prior to visit.    Not on File ROS neg/noncontributory except as noted HPI/below      Objective:     BP 130/70   Pulse (!) 56   Temp 97.6 F (36.4 C) (Temporal)   Ht '6\' 2"'$  (1.88 m)   Wt 223 lb 6 oz (101.3 kg)   SpO2 97%   BMI 28.68 kg/m  Wt Readings from Last 3 Encounters:  05/16/22 223 lb 6 oz (101.3 kg)  10/31/21 207 lb 6.4 oz (94.1 kg)  02/20/21 217 lb 3.2 oz (98.5 kg)    Physical Exam   Gen: WDWN NAD HEENT: NCAT, conjunctiva not injected, sclera nonicteric MSK: no gross abnormalities.  NEURO: A&O x3.  CN II-XII intact.  PSYCH: normal mood. Good eye contact Healed, approx 3.5-4cm lac on R scalp.  7 staples removed w/staple remover without incident.  Pt tolerated well.  No s/s infection.      Assessment & Plan:   Problem List Items Addressed This Visit   None Visit Diagnoses     Laceration of skin of scalp, initial encounter    -  Primary      Laceration of scalp R side-repaired in ER on 12/16 w/7 staples.  Removed.  Pt tolerated well.  Monitor for s/s infection.    Has f/u Dr. Yong Channel for thyroid nodule found incidentally on Ct c-spine.  No orders of the defined types were placed in this encounter.   Wellington Hampshire, MD

## 2022-05-16 NOTE — Patient Instructions (Signed)
Happy New Year  Continue to monitor for infection.

## 2022-05-25 ENCOUNTER — Encounter: Payer: Self-pay | Admitting: Family Medicine

## 2022-05-25 ENCOUNTER — Ambulatory Visit: Payer: BLUE CROSS/BLUE SHIELD | Admitting: Family Medicine

## 2022-05-25 VITALS — BP 128/78 | HR 64 | Temp 98.0°F | Resp 16 | Ht 74.0 in | Wt 206.0 lb

## 2022-05-25 DIAGNOSIS — E041 Nontoxic single thyroid nodule: Secondary | ICD-10-CM | POA: Diagnosis not present

## 2022-05-25 DIAGNOSIS — M4310 Spondylolisthesis, site unspecified: Secondary | ICD-10-CM | POA: Diagnosis not present

## 2022-05-25 DIAGNOSIS — M47812 Spondylosis without myelopathy or radiculopathy, cervical region: Secondary | ICD-10-CM

## 2022-05-25 NOTE — Patient Instructions (Addendum)
We will call you within two weeks about your referral for thyroid ultrasound through [facility].  Their phone number is [phone].  Please call them if you have not heard in 1-2 weeks  We will call you within two weeks about your referral to [facility]. If you do not hear within 2 weeks, give [place] a call.   Recommended follow up: Return for next already scheduled visit or sooner if needed.
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
## 2022-06-12 ENCOUNTER — Ambulatory Visit
Admission: RE | Admit: 2022-06-12 | Discharge: 2022-06-12 | Disposition: A | Discharge status: Home / Self Care | Payer: BLUE CROSS/BLUE SHIELD | Source: Ambulatory Visit | Attending: Family Medicine | Admitting: Family Medicine

## 2022-06-12 DIAGNOSIS — E041 Nontoxic single thyroid nodule: Secondary | ICD-10-CM

## 2022-06-20 NOTE — Progress Notes (Signed)
Juan Bender [address] [place] Phone: [phone] Subjective:   IVilma Bender, am serving as a scribe for Dr. Hulan Saas.  I'm seeing this patient by the request  of:  Juan Olp, MD  CC: Neck pain  [id number]  Juan Bender is a 57 y.o. male coming in with complaint of neck pain. Patient states neck pain that is consistent and getting worse. More achy than sharp pain. No radiating numbness or pain. Has limited ROM in neck patient did have a CT scan done at an outside facility.  We do not have the disc but we do have the read.  Did say some degenerative disc but no acute abnormalities noted.  Patient denies any radiation down the arm, any numbness or tingling.      Past Medical History:  Diagnosis Date   Cancer (Juan Bender)    Skin cancer, melanoma and basel cell   History of cardiac catheterization    a. LHC 8/16:  no CAD, EF 55-65%, Normal caliber aortic root with no evidence of dissection   History of echocardiogram    Echo 8/16: Mild LVH, vigorous LVF, EF 65-70%, normal wall motion, normal diastolic function, normal RV function, trivial AI, trivial PI, trivial TR   History of nuclear stress test    a. Myoview 8/16:  Nuclear stress EF: 63%.  Low risk study with no prior infarct or ischemia.    History of skin cancer    melanoma removed x3   Molluscum contagiosum 12/30/2013   Pectus excavatum    Peyronie's disease    RECTAL FISSURE 02/27/2010   Qualifier: Diagnosis of  By: Juan Mocha MD, Jory Ee    Past Surgical History:  Procedure Laterality Date   CARDIAC CATHETERIZATION N/A 12/30/2014   Procedure: Left Heart Cath and Coronary Angiography;  Surgeon: Burnell Blanks, MD;  Location: [facility];  Service: Cardiovascular;  Laterality: N/A;   EYE SURGERY     lasik   MELANOMA EXCISION  2011   x3   NESBIT PROCEDURE  12/21/2011   Peyronie related. Procedure: NESBIT PROCEDURE;  Surgeon: Claybon Jabs, MD;   Location: [facility];  Service: Urology;  Laterality: N/A;  with 16 dot plication   VASECTOMY     Social History   Socioeconomic History   Marital status: Married    Spouse name: Not on file   Number of children: 3   Years of education: Not on file   Highest education level: Not on file  Occupational History   Occupation: real estate management  Tobacco Use   Smoking status: Former    Types: Cigars    Quit date: 12/17/2001    Years since quitting: 20.5   Smokeless tobacco: Never   Tobacco comments:    occasional cigar smoker when golfing  Substance and Sexual Activity   Alcohol use: Yes    Alcohol/week: 5.0 standard drinks of alcohol    Types: 5 Glasses of wine per week   Drug use: No   Sexual activity: Yes  Other Topics Concern   Not on file  Social History Narrative   Married 27 years in 2020. 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  Hobbies: home improvement, work in yard, travel   Social Determinants of Radio broadcast assistant Strain: Not on file  Food Insecurity: Not on file  Transportation Needs: Not on file  Physical Activity: Not on file  Stress: Not on file  Social Connections: Not on file   Not on File Family History  Problem Relation Age of Onset   Heart disease Mother    Heart disease Father    Hyperlipidemia Father    Hypertension Father    Heart attack Father 46       s/p CABG   Dementia Father    Colon polyps Father    Healthy Sister    Colon cancer Neg Hx    Esophageal cancer Neg Hx    Rectal cancer Neg Hx    Stomach cancer Neg Hx      Current Outpatient Medications (Cardiovascular):    tadalafil (CIALIS) 20 MG tablet, Take 1 tablet (20 mg total) by mouth every other day as needed for  erectile dysfunction.  Current Outpatient Medications (Respiratory):    Cetirizine HCl (ZYRTEC ALLERGY) 10 MG CAPS,     Current Outpatient Medications (Other):    tiZANidine (ZANAFLEX) 2 MG tablet, Take 1 tablet (2 mg total) by mouth at bedtime as needed for muscle spasms.   APPLE CIDER VINEGAR PO, Take by mouth.   Ascorbic Acid (VITAMIN C) 500 MG CHEW,    Cholecalciferol (VITAMIN D3 PO),    Misc Natural Products (TURMERIC CURCUMIN) CAPS,    Omega-3 Fatty Acids (FISH OIL) 1000 MG CAPS,    Zinc 50 MG CAPS,    Reviewed prior external information including notes and imaging from  primary care provider As well as notes that were available from care everywhere and other healthcare systems.  Past medical history, social, surgical and family history all reviewed in electronic medical record.  No pertanent information unless stated regarding to the chief complaint.   Review of Systems:  No headache, visual changes, nausea, vomiting, diarrhea, constipation, dizziness, abdominal pain, skin rash, fevers, chills, night sweats, weight loss, swollen lymph nodes, body aches, joint swelling, chest pain, shortness of breath, mood changes. POSITIVE muscle aches  Objective  Blood pressure 126/80, pulse 64, height '6\' 2"'$  (1.88 m), weight 224 lb (101.6 kg), SpO2 99 %.   General: No apparent distress alert and oriented x3 mood and affect normal, dressed appropriately.  HEENT: Pupils equal, extraocular movements intact  Respiratory: Patient's speak in full sentences and does not appear short of breath  Cardiovascular: No lower extremity edema, non tender, no erythema  Neck exam does have some loss of lordosis.  Some limited sidebending bilaterally left greater than right.  Patient does have some mild crepitus noted. 5 out of 5 strength of the upper extremities  [id number]; 15 additional minutes spent for Therapeutic exercises as stated in above notes.  This included exercises focusing on stretching,  strengthening, with significant focus on eccentric aspects.   Long term goals include an improvement in range of motion, strength, endurance as well as avoiding reinjury. Patient's frequency would include in 1-2 times a day, 3-5 times a week for a duration of 6-12 weeks. Exercises that included:  Basic scapular stabilization to include adduction and depression of scapula Scaption, focusing on proper movement and good control Internal and External rotation utilizing a theraband, with elbow tucked at side entire time Rows with theraband   Proper technique shown and discussed handout in great detail with ATC.  All questions were discussed and  answered.     Impression and Recommendations:    The above documentation has been reviewed and is accurate and complete Lyndal Pulley, DO

## 2022-06-26 ENCOUNTER — Ambulatory Visit (INDEPENDENT_AMBULATORY_CARE_PROVIDER_SITE_OTHER): Payer: BLUE CROSS/BLUE SHIELD

## 2022-06-26 ENCOUNTER — Ambulatory Visit: Payer: BLUE CROSS/BLUE SHIELD | Admitting: Family Medicine

## 2022-06-26 ENCOUNTER — Encounter: Payer: Self-pay | Admitting: Family Medicine

## 2022-06-26 VITALS — BP 126/80 | HR 64 | Ht 74.0 in | Wt 224.0 lb

## 2022-06-26 DIAGNOSIS — M542 Cervicalgia: Secondary | ICD-10-CM

## 2022-06-26 DIAGNOSIS — M5030 Other cervical disc degeneration, unspecified cervical region: Secondary | ICD-10-CM | POA: Diagnosis not present

## 2022-06-26 MED ORDER — TIZANIDINE HCL 2 MG PO TABS: 2.0000 mg | ORAL_TABLET | Freq: Every evening | ORAL | 0 refills | Status: DC | PRN

## 2022-06-26 NOTE — Assessment & Plan Note (Addendum)
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  Patient does have significant limitation in sidebending and extension and rotation to the left.  Concern for some atypical facet arthropathy that could be contributing.  Patient though did have surgical intervention for melanoma on the left side of the neck that we will need to continue to monitor as well.  Discussed which activities to do and which ones to avoid.  Increase activity slowly.  Discussed posture and ergonomics.  Follow-up with me again in 6 to 8 weeks.  Worsening symptoms secondary to patient's melanoma in the surrounding area would consider advanced imaging if necessary.  Patient will follow-up again as stated in 6 to 8 weeks Zanaflex 2 mg also prescribed for nighttime pain.

## 2022-06-26 NOTE — Patient Instructions (Addendum)
Xray today Zanaflex '2mg'$  at night as need Do prescribed exercises at least 3x a week 3 ibuprofen 3x a day for 3 days See you again in 5-6 weeks

## 2022-07-22 ENCOUNTER — Other Ambulatory Visit: Payer: Self-pay | Admitting: Family Medicine

## 2022-11-02 ENCOUNTER — Encounter: Payer: BLUE CROSS/BLUE SHIELD | Admitting: Family Medicine
